# Patient Record
Sex: Female | Born: 1955 | Race: White | Hispanic: No | Marital: Married | Smoking: Former smoker
Health system: Southern US, Community
[De-identification: ages and names within clinical notes are randomized; demographics above are authoritative.]

## PROBLEM LIST (undated history)

## (undated) DIAGNOSIS — J189 Pneumonia, unspecified organism: Secondary | ICD-10-CM

## (undated) DIAGNOSIS — E876 Hypokalemia: Secondary | ICD-10-CM

## (undated) DIAGNOSIS — K5720 Diverticulitis of large intestine with perforation and abscess without bleeding: Secondary | ICD-10-CM

## (undated) DIAGNOSIS — J069 Acute upper respiratory infection, unspecified: Secondary | ICD-10-CM

## (undated) DIAGNOSIS — IMO0001 Reserved for inherently not codable concepts without codable children: Secondary | ICD-10-CM

## (undated) DIAGNOSIS — J302 Other seasonal allergic rhinitis: Secondary | ICD-10-CM

## (undated) DIAGNOSIS — I10 Essential (primary) hypertension: Secondary | ICD-10-CM

## (undated) DIAGNOSIS — R1084 Generalized abdominal pain: Secondary | ICD-10-CM

## (undated) DIAGNOSIS — K219 Gastro-esophageal reflux disease without esophagitis: Secondary | ICD-10-CM

## (undated) DIAGNOSIS — Z0000 Encounter for general adult medical examination without abnormal findings: Secondary | ICD-10-CM

## (undated) DIAGNOSIS — J42 Unspecified chronic bronchitis: Secondary | ICD-10-CM

## (undated) DIAGNOSIS — Z6841 Body Mass Index (BMI) 40.0 and over, adult: Secondary | ICD-10-CM

## (undated) DIAGNOSIS — N3941 Urge incontinence: Secondary | ICD-10-CM

## (undated) HISTORY — DX: Essential (primary) hypertension: I10

## (undated) HISTORY — DX: Body Mass Index (BMI) 40.0 and over, adult: Z684

## (undated) HISTORY — DX: Generalized abdominal pain: R10.84

## (undated) HISTORY — DX: Acute upper respiratory infection, unspecified: J06.9

## (undated) HISTORY — DX: Urge incontinence: N39.41

## (undated) HISTORY — DX: Diverticulitis of large intestine with perforation and abscess without bleeding: K57.20

## (undated) HISTORY — DX: Encounter for general adult medical examination without abnormal findings: Z00.00

## (undated) HISTORY — PX: BUNIONECTOMY: SHX129

## (undated) HISTORY — DX: Gastro-esophageal reflux disease without esophagitis: K21.9

## (undated) HISTORY — PX: TUBAL LIGATION: SHX77

## (undated) HISTORY — DX: Hypokalemia: E87.6

## (undated) HISTORY — DX: Other seasonal allergic rhinitis: J30.2

## (undated) HISTORY — DX: Reserved for inherently not codable concepts without codable children: IMO0001

## (undated) HISTORY — DX: Morbid (severe) obesity due to excess calories: E66.01

---

## 1989-05-25 HISTORY — PX: TUBAL LIGATION: SHX77

## 2004-12-29 ENCOUNTER — Ambulatory Visit: Payer: Self-pay | Admitting: Family Medicine

## 2006-12-08 ENCOUNTER — Ambulatory Visit: Payer: Self-pay | Admitting: Internal Medicine

## 2007-10-11 ENCOUNTER — Ambulatory Visit: Payer: Self-pay | Admitting: Urology

## 2007-10-17 ENCOUNTER — Ambulatory Visit: Payer: Self-pay | Admitting: Urology

## 2008-05-25 HISTORY — PX: PLANTAR FASCIA RELEASE: SHX2239

## 2008-08-21 ENCOUNTER — Ambulatory Visit: Payer: Self-pay | Admitting: Gastroenterology

## 2008-08-28 ENCOUNTER — Telehealth: Payer: Self-pay | Admitting: Gastroenterology

## 2008-09-03 ENCOUNTER — Ambulatory Visit: Payer: Self-pay | Admitting: Gastroenterology

## 2009-04-11 ENCOUNTER — Ambulatory Visit: Payer: Self-pay | Admitting: Podiatry

## 2011-03-31 ENCOUNTER — Telehealth: Payer: Self-pay | Admitting: Internal Medicine

## 2011-06-03 NOTE — Telephone Encounter (Signed)
error 

## 2011-09-04 ENCOUNTER — Other Ambulatory Visit: Payer: Self-pay | Admitting: Internal Medicine

## 2011-09-04 MED ORDER — ESOMEPRAZOLE MAGNESIUM 40 MG PO CPDR: 40.0000 mg | DELAYED_RELEASE_CAPSULE | Freq: Every day | ORAL | Status: DC

## 2012-04-06 ENCOUNTER — Encounter: Payer: Self-pay | Admitting: Internal Medicine

## 2012-04-06 ENCOUNTER — Ambulatory Visit (INDEPENDENT_AMBULATORY_CARE_PROVIDER_SITE_OTHER): Payer: BC Managed Care – PPO | Admitting: Internal Medicine

## 2012-04-06 VITALS — BP 124/88 | HR 61 | Temp 98.0°F | Ht 67.0 in | Wt 239.1 lb

## 2012-04-06 DIAGNOSIS — R05 Cough: Secondary | ICD-10-CM

## 2012-04-06 MED ORDER — LORATADINE 10 MG PO TABS: ORAL_TABLET | ORAL | Status: AC

## 2012-04-06 MED ORDER — MOMETASONE FURO-FORMOTEROL FUM 100-5 MCG/ACT IN AERO: INHALATION_SPRAY | RESPIRATORY_TRACT | Status: DC

## 2012-04-06 NOTE — Progress Notes (Signed)
  Subjective:    Patient ID: Judith Nelson, female    DOB: 1955/12/23  MRN: [id number]  HPI  37 yowm quit smoking 1990 no trouble at all then onset of episodes of bronchitis starting in 2009 referred 04/06/2012 by Dr Darrick Huntsman to [facility] office for evaluation.   04/06/2012 1st pulmonary evaluation (previously neg allergy by [facility])  on maint clariton cc episodic acute onest runny nose, sneezing, clear mucus evolving over 3-4 days to bad coughing rattling in chest this episode started first day of November 2013 so restarted advair, singulair, prednisone taper off on day of ov still with rattling in upper chest, sinus symptoms better, no purulent sputum.  Sob with exertion but not adls  Typical of prev episodes 4 x in past year, 100% better between while being maintained only on clariton but no maint ppi or inhalers and "doesn't want to take a lot of meds".   Sleeping ok without nocturnal  or early am exacerbation  of respiratory  c/o's or need for noct saba. Also denies any obvious fluctuation of symptoms with weather or environmental changes or other aggravating or alleviating factors except as outlined above    Review of Systems  Constitutional: Negative for fever, chills and unexpected weight change.  HENT: Positive for congestion and sneezing. Negative for ear pain, nosebleeds, sore throat, rhinorrhea, trouble swallowing, dental problem, voice change, postnasal drip and sinus pressure.   Eyes: Negative for visual disturbance.  Respiratory: Positive for cough and shortness of breath. Negative for choking.   Cardiovascular: Negative for chest pain and leg swelling.  Gastrointestinal: Negative for vomiting, abdominal pain and diarrhea.  Genitourinary: Negative for difficulty urinating.  Musculoskeletal: Negative for arthralgias.  Skin: Negative for rash.  Neurological: Positive for headaches. Negative for tremors and syncope.  Hematological: Does not bruise/bleed easily.       Objective:     Physical Exam  amb mod obese with wf with rattling upper airway cough with some pseudowheezing Wt Readings from Last 3 Encounters:  04/06/12 239 lb 1.9 oz (108.464 kg)   HEENT: nl dentition, turbinates, and orophanx. Nl external ear canals without cough reflex   NECK :  without JVD/Nodes/TM/ nl carotid upstrokes bilaterally   LUNGS: no acc muscle use, trace end exp wheeze vs pseudowheeze without cough on insp or exp maneuvers   CV:  RRR  no s3 or murmur or increase in P2, no edema   ABD:  soft and nontender with nl excursion in the supine position. No bruits or organomegaly, bowel sounds nl  MS:  warm without deformities, calf tenderness, cyanosis or clubbing  SKIN: warm and dry without lesions    NEURO:  alert, approp, no deficits        Assessment & Plan:

## 2012-04-06 NOTE — Patient Instructions (Addendum)
At the first sign of any respiratory flare start nexium 40 mg Take 30-60 min before first meal of the day and Pepcid at bedtime until better for an entire week   For cough / wheezing/ short of breath dulera 100 2 every 12hours   GERD (REFLUX)  is an extremely common cause of respiratory symptoms, many times with no significant heartburn at all.    It can be treated with medication, but also with lifestyle changes including avoidance of late meals, excessive alcohol, smoking cessation, and avoid fatty foods, chocolate, peppermint, colas, red wine, and acidic juices such as orange juice.  NO MINT OR MENTHOL PRODUCTS SO NO COUGH DROPS  USE SUGARLESS CANDY INSTEAD (jolley ranchers or Stover's)  NO OIL BASED VITAMINS - use powdered substitutes.  Stop singulair   For itchy sneezing or runny nose take clariton or Zyrtec daily    If you are satisfied with your treatment plan let your doctor know and  she can either refill your medications or you can return here when your prescription runs out.     If in any way you are not 100% satisfied,  please tell us.  If 100% better, tell your friends!

## 2012-04-07 NOTE — Assessment & Plan Note (Signed)
The most common causes of chronic cough in immunocompetent adults include the following: upper airway cough syndrome (UACS), previously referred to as postnasal drip syndrome (PNDS), which is caused by variety of rhinosinus conditions; (2) asthma; (3) GERD; (4) chronic bronchitis from cigarette smoking or other inhaled environmental irritants; (5) nonasthmatic eosinophilic bronchitis; and (6) bronchiectasis.   These conditions, singly or in combination, have accounted for up to 94% of the causes of chronic cough in prospective studies.   Other conditions have constituted no >6% of the causes in prospective studies These have included bronchogenic carcinoma, chronic interstitial pneumonia, sarcoidosis, left ventricular failure, ACEI-induced cough, and aspiration from a condition associated with pharyngeal dysfunction.   The pattern of this cough sounds like episodic asthma but with > 1 exac per year may need to consider chronic ICS, which she's reluctant to do.  In meantime,  Explained natural history of uri and why it's necessary in patients at risk to treat GERD aggressively  at least  short term   to reduce risk of evolving cyclical cough initially  triggered by epithelial injury and a heightened sensitivty to the effects of any upper airway irritants,  most importantly acid - related.  That is, the more sensitive the epithelium damaged for virus, the more the cough, the more the secondary reflux (especially in those prone to reflux) the more the irritation of the sensitive mucosa and so on in a cyclical pattern.   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

## 2012-05-27 ENCOUNTER — Other Ambulatory Visit: Payer: Self-pay | Admitting: *Deleted

## 2012-05-27 MED ORDER — ESOMEPRAZOLE MAGNESIUM 40 MG PO CPDR: 40.0000 mg | DELAYED_RELEASE_CAPSULE | Freq: Every day | ORAL | Status: DC | PRN

## 2013-12-26 ENCOUNTER — Encounter: Payer: Self-pay | Admitting: Gastroenterology

## 2014-03-23 ENCOUNTER — Other Ambulatory Visit: Payer: Self-pay | Admitting: *Deleted

## 2014-03-23 ENCOUNTER — Encounter: Payer: Self-pay | Admitting: Cardiovascular Disease

## 2014-03-23 ENCOUNTER — Telehealth: Payer: Self-pay

## 2014-03-23 ENCOUNTER — Ambulatory Visit (INDEPENDENT_AMBULATORY_CARE_PROVIDER_SITE_OTHER): Payer: BC Managed Care – PPO | Admitting: Cardiovascular Disease

## 2014-03-23 ENCOUNTER — Encounter (INDEPENDENT_AMBULATORY_CARE_PROVIDER_SITE_OTHER): Payer: Self-pay

## 2014-03-23 VITALS — BP 160/98 | HR 79 | Ht 67.0 in | Wt 242.8 lb

## 2014-03-23 DIAGNOSIS — I10 Essential (primary) hypertension: Secondary | ICD-10-CM

## 2014-03-23 DIAGNOSIS — R079 Chest pain, unspecified: Secondary | ICD-10-CM

## 2014-03-23 DIAGNOSIS — R0602 Shortness of breath: Secondary | ICD-10-CM

## 2014-03-23 DIAGNOSIS — E785 Hyperlipidemia, unspecified: Secondary | ICD-10-CM

## 2014-03-23 DIAGNOSIS — R0609 Other forms of dyspnea: Secondary | ICD-10-CM

## 2014-03-23 HISTORY — DX: Essential (primary) hypertension: I10

## 2014-03-23 MED ORDER — ATORVASTATIN CALCIUM 40 MG PO TABS: 40.0000 mg | ORAL_TABLET | Freq: Every day | ORAL | Status: DC

## 2014-03-23 MED ORDER — LISINOPRIL 10 MG PO TABS: 10.0000 mg | ORAL_TABLET | Freq: Every day | ORAL | Status: DC

## 2014-03-23 MED ORDER — HYDROCHLOROTHIAZIDE 25 MG PO TABS: 25.0000 mg | ORAL_TABLET | Freq: Every day | ORAL | Status: DC

## 2014-03-23 MED ORDER — POTASSIUM CHLORIDE CRYS ER 20 MEQ PO TBCR: 20.0000 meq | EXTENDED_RELEASE_TABLET | Freq: Every day | ORAL | Status: DC

## 2014-03-23 NOTE — Telephone Encounter (Signed)
Requested Prescriptions   Signed Prescriptions Disp Refills  . lisinopril (PRINIVIL,ZESTRIL) 10 MG tablet 30 tablet 3    Sig: Take 1 tablet (10 mg total) by mouth daily.    Authorizing Provider: Thayer Headings    Ordering User: Britt Bottom

## 2014-03-23 NOTE — Progress Notes (Signed)
Glenard Haring Date of Birth  03/27/1956       [facility] Office [address], [place], [place]  [address]   [place], [place]  [address] [place]   Fax  [phone]     Fax [phone]  Problem List: 1. Hypertension 2. Hyperlipidemia  History of Present Illness:  Judith Nelson is a 58 year old female who is self-referred. She has a strong family history of multiple cardiac issues. She has a history of hypertension and hyperlipidemia. She does not go to the doctor on a regular basis.  She goes to the wellness center at [place]. Her BP was noted to be elevated.    She has had a stress test in the past (at [facility]) .  Was negative.  Does not watch her salt - eats broth frequently.   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    Takes care of her mother. Does not get any regular exercise. No Cp.  She does have DOE .  Has shortness of breath walking up the stairs up to her desk.   Non smoker , quit 20 years ago - was smoking 2 ppd. ETOH - on occasion Fhx:  Mother - CABG, carotid disease.( smoker)  Father - DM, ETOH , pancreatic cancer   Current Outpatient Prescriptions on File Prior to Visit  Medication Sig Dispense Refill  . Cholecalciferol (VITAMIN D PO) Take 5,000 Units by mouth daily.      . Cyanocobalamin (VITAMIN B 12 PO) 5000 mcg daily      . dextromethorphan-guaiFENesin (MUCINEX DM) 30-600 MG per 12 hr tablet Take 1 tablet by mouth every 12 (twelve) hours.      . diphenhydrAMINE (BENADRYL) 50 MG capsule Take 50 mg by mouth at bedtime.      Marland Kitchen esomeprazole (NEXIUM) 40 MG capsule Take 1 capsule (40 mg total) by mouth daily as needed.  30 capsule  2  . loratadine (CLARITIN) 10 MG tablet One daily as needed for itching and sneezing  30 tablet    . Multiple Vitamin (MULTIVITAMIN) capsule Take 1 capsule by mouth daily.       No current facility-administered medications on file  prior to visit.    Allergies  Allergen Reactions  . Sulfa Antibiotics     Fever- had reaction as a child    Past Medical History  Diagnosis Date  . Chronic headache   . Diverticulitis   . Palpitations   . Hypertension   . Reflux     Past Surgical History  Procedure Laterality Date  . Tubal ligation    . Bunionectomy      History  Smoking status  . Former Smoker -- 2.00 packs/day for 15 years  . Types: Cigarettes  . Quit date: 05/25/1988  Smokeless tobacco  . Never Used    History  Alcohol Use  . 1.2 oz/week  . 2 Cans of beer per week    Family History  Problem Relation Age of Onset  . Hypertension Mother   . Hyperlipidemia Mother   . Heart disease Mother     CABG & valve replacement   . Hypertension Father   . Hyperlipidemia Sister   . Hypertension Sister   . Hypertension Sister   . Hyperlipidemia Sister   . Hypertension Sister   . Hyperlipidemia Sister     Reviw of Systems:  Reviewed in  the HPI.  All other systems are negative.  Physical Exam: Blood pressure 160/98, pulse 79, height 5\' 7"  (1.702 m), weight 242 lb 12 oz (110.111 kg). Wt Readings from Last 3 Encounters:  03/23/14 242 lb 12 oz (110.111 kg)  04/06/12 239 lb 1.9 oz (108.464 kg)     General: Well developed, well nourished, in no acute distress.  Head: Normocephalic, atraumatic, sclera non-icteric, mucus membranes are moist,   Neck: Supple. Carotids are 2 + without bruits. No JVD   Lungs: Clear   Heart: RR, normal S1S2  Abdomen: Soft, non-tender, non-distended with normal bowel sounds.  Msk:  Strength and tone are normal   Extremities: No clubbing or cyanosis. No edema.  Distal pedal pulses are 2+ and equal    Neuro: CN II - XII intact.  Alert and oriented X 3.   Psych:  Normal   ECG: 03/23/2014: Normal sinus rhythm at 79. She has no ST or T wave changes.  Assessment / Plan:

## 2014-03-23 NOTE — Assessment & Plan Note (Signed)
Will start her on atorvastatin 40 mg a day. Check lipids, liver, BPM in 3 months.

## 2014-03-23 NOTE — Telephone Encounter (Signed)
Medication made to Rx list: DC HCTZ  DC K-Dur  Refilled: Lisinopril 10 mg qd [facility]

## 2014-03-23 NOTE — Telephone Encounter (Signed)
Please see note below. 

## 2014-03-23 NOTE — Addendum Note (Signed)
Addended by: Britt Bottom on: 03/23/2014 04:16 PM   Modules accepted: Orders, Medications

## 2014-03-23 NOTE — Telephone Encounter (Signed)
Pt called, states she was prescribed a medication this morning with sulfa, and she is allergic to it. States if she would have taken this medicine she would have been hospitalized . Pt states she is going for a second opinion. I reassured pt a nurse would call her.

## 2014-03-23 NOTE — Telephone Encounter (Signed)
*  chang

## 2014-03-23 NOTE — Assessment & Plan Note (Addendum)
Judith Nelson has  had hypertension for years. She was recently seen at the wellness Center and was found to have markedly elevated blood pressure. She does not make any attempt to avoid salt and in fact from her description, it sounds like she eats a fairly high salt diet.  We'll start her on hydrochlorothiazide 25 mg a day and potassium chloride 20 mEq a day. I've advised her to stay away from eating excess salt. BMP in 1 month  Have recommended that she work on a better diet, exercise, and weight loss plan. Think that a lot of her shortness breath is due to her hypertension and affected her blood pressure likely dose up with any sort of exertion.

## 2014-03-23 NOTE — Telephone Encounter (Signed)
I agree with starting Lisinopril 10 mg a day DC HCTZ DC kdur  She should get a BMP in 1 month .  It can be drawn here or at her primary medical doctor's office. i can see her back if she would like. Or if she would prefer, she can follow up with primary.

## 2014-03-23 NOTE — Telephone Encounter (Signed)
Spoke with pt.  Explained the difference between sulfa compounds within sulfa abx and HCTZ.  She understands but still has some concerns.  Her reaction as a child was a fever but she also had a UTI at the time.  ? If it was even related to the sulfa abx.  She does have the medication but does not want to start it over the weekend.  Will discuss with Dr. Acie Fredrickson to see if we can initiate alternate therapy first.  We also discussed the need for a PCP.  She states after her visit today she realized that is probably who she needed to see rather than a cardiologist.  She is interested in establishing care with [facility] in [place].  Agreed this would be the best plan moving forward.

## 2014-03-23 NOTE — Addendum Note (Signed)
Addended by: Britt Bottom on: 03/23/2014 04:49 PM   Modules accepted: Orders, Medications

## 2014-03-23 NOTE — Assessment & Plan Note (Signed)
Judith Nelson has significant shortness of breath with exertion. I suspect most of this was due to her weight and generalized deconditioning. She's not had any symptoms that sound like ischemia.  I have recommended that she start a regular exercise program. If she is able to advance in her exercise regimen, then I do not think that she needs any additional testing. On the other hand if she develops episodes of chest tightness with exertion, we will need to perform other evaluations

## 2014-03-23 NOTE — Patient Instructions (Addendum)
Start Hydrochlorothiazide 25 mg take one tablet daily. Start K-Dur 20 meq take one tablet daily. Start Atorvastatin 40 mg take one tablet daily at bedtime.  Need to have blood work in 1 month: BMP  Follow up in 3 months with Dr. Acie Fredrickson with BMP/LIV/LIP

## 2014-03-26 NOTE — Telephone Encounter (Signed)
Left message for pt to call back  °

## 2014-03-28 NOTE — Telephone Encounter (Signed)
Left detailed message on pt's vm w/ Dr. Elmarie Shiley recommendation.  Asked her to call back to confirm and to schedule lab appt.

## 2014-04-27 ENCOUNTER — Ambulatory Visit: Payer: BC Managed Care – PPO | Admitting: Cardiovascular Disease

## 2014-07-12 ENCOUNTER — Telehealth: Payer: Self-pay | Admitting: *Deleted

## 2014-07-12 NOTE — Telephone Encounter (Signed)
Pt called asking if we could send over records to Dr.Fath.

## 2014-07-19 DIAGNOSIS — E876 Hypokalemia: Secondary | ICD-10-CM | POA: Insufficient documentation

## 2014-07-19 HISTORY — DX: Hypokalemia: E87.6

## 2015-05-29 ENCOUNTER — Ambulatory Visit (INDEPENDENT_AMBULATORY_CARE_PROVIDER_SITE_OTHER): Payer: BLUE CROSS/BLUE SHIELD | Admitting: Family Medicine

## 2015-05-29 ENCOUNTER — Encounter: Payer: Self-pay | Admitting: Family Medicine

## 2015-05-29 VITALS — BP 152/90 | HR 70 | Temp 98.5°F | Ht 67.0 in | Wt 257.8 lb

## 2015-05-29 DIAGNOSIS — Z0000 Encounter for general adult medical examination without abnormal findings: Secondary | ICD-10-CM | POA: Diagnosis not present

## 2015-05-29 DIAGNOSIS — Z130 Encounter for screening for diseases of the blood and blood-forming organs and certain disorders involving the immune mechanism: Secondary | ICD-10-CM

## 2015-05-29 DIAGNOSIS — N3941 Urge incontinence: Secondary | ICD-10-CM | POA: Diagnosis not present

## 2015-05-29 DIAGNOSIS — I10 Essential (primary) hypertension: Secondary | ICD-10-CM | POA: Diagnosis not present

## 2015-05-29 DIAGNOSIS — J069 Acute upper respiratory infection, unspecified: Secondary | ICD-10-CM

## 2015-05-29 DIAGNOSIS — K219 Gastro-esophageal reflux disease without esophagitis: Secondary | ICD-10-CM | POA: Insufficient documentation

## 2015-05-29 DIAGNOSIS — Z6841 Body Mass Index (BMI) 40.0 and over, adult: Secondary | ICD-10-CM | POA: Diagnosis not present

## 2015-05-29 HISTORY — DX: Urge incontinence: N39.41

## 2015-05-29 HISTORY — DX: Encounter for general adult medical examination without abnormal findings: Z00.00

## 2015-05-29 HISTORY — DX: Acute upper respiratory infection, unspecified: J06.9

## 2015-05-29 HISTORY — DX: Morbid (severe) obesity due to excess calories: E66.01

## 2015-05-29 LAB — COMPREHENSIVE METABOLIC PANEL
ALT: 22 U/L (ref 0–35)
AST: 17 U/L (ref 0–37)
Albumin: 4.2 g/dL (ref 3.5–5.2)
Alkaline Phosphatase: 47 U/L (ref 39–117)
BUN: 9 mg/dL (ref 6–23)
CHLORIDE: 103 meq/L (ref 96–112)
CO2: 30 meq/L (ref 19–32)
CREATININE: 0.77 mg/dL (ref 0.40–1.20)
Calcium: 10 mg/dL (ref 8.4–10.5)
GFR: 81.76 mL/min (ref >60.00)
GLUCOSE: 95 mg/dL (ref 70–99)
Potassium: 4.6 mEq/L (ref 3.5–5.1)
SODIUM: 140 meq/L (ref 135–145)
Total Bilirubin: 0.5 mg/dL (ref 0.2–1.2)
Total Protein: 6.9 g/dL (ref 6.0–8.3)

## 2015-05-29 LAB — LDL CHOLESTEROL, DIRECT: LDL DIRECT: 174 mg/dL

## 2015-05-29 LAB — CBC
HCT: 45.7 % (ref 36.0–46.0)
Hemoglobin: 15.4 g/dL — ABNORMAL HIGH (ref 12.0–15.0)
MCHC: 33.6 g/dL (ref 30.0–36.0)
MCV: 89.8 fl (ref 78.0–100.0)
Platelets: 254 10*3/uL (ref 150.0–400.0)
RBC: 5.09 Mil/uL (ref 3.87–5.11)
RDW: 13.5 % (ref 11.5–15.5)
WBC: 9.3 10*3/uL (ref 4.0–10.5)

## 2015-05-29 LAB — HEMOGLOBIN A1C: Hgb A1c MFr Bld: 5.3 % (ref 4.6–6.5)

## 2015-05-29 LAB — LIPID PANEL
CHOL/HDL RATIO: 5
Cholesterol: 237 mg/dL — ABNORMAL HIGH (ref 0–200)
HDL: 43.1 mg/dL (ref >39.00)
NONHDL: 193.66
Triglycerides: 205 mg/dL — ABNORMAL HIGH (ref 0.0–149.0)
VLDL: 41 mg/dL — AB (ref 0.0–40.0)

## 2015-05-29 LAB — TSH: TSH: 2.36 u[IU]/mL (ref 0.35–4.50)

## 2015-05-29 MED ORDER — TOLTERODINE TARTRATE ER 4 MG PO CP24: 4.0000 mg | ORAL_CAPSULE | Freq: Every day | ORAL | Status: DC

## 2015-05-29 NOTE — Progress Notes (Signed)
Pre visit review using our clinic review tool, if applicable. No additional management support is needed unless otherwise documented below in the visit note. 

## 2015-05-29 NOTE — Progress Notes (Signed)
Subjective:  Patient ID: Glenard Haring, female    DOB: 11-02-1955  Age: 60 y.o. MRN: [id number]  CC: Establish care; cold; incontinence  HPI JESSAMINE BARCIA is a 60 y.o. female presents to the clinic today to establish care.   Preventative Healthcare  Pap smear: Years ago. In need of pap smear. Patient states that she  Has been given "conflicting information" about Pap smear.  Mammogram:  Has never had a mammogram.  Colonoscopy: Up to date (done in 2010).  Immunizations  Tetanus -  Unsure when she's had a but states that it was in the last 10 years.  Pneumococcal -  Not indicated.  Flu -  Up-to-date.  Zoster -  Not indicated.  Hepatitis C screening -  Has not had screening.  Labs:  In need of screening labs today.  Alcohol use: See below.  Smoking/tobacco use: Former smoker.   Regular dental exams: Yes.   Cold  Patient states that she's had cold symptoms since Sunday.  She states that she's been expansion process drip, sinus congestion.  No associated fever.  She's been using Mucinex D with some improvement.  No known exacerbating factors.  She states that she has had recent sick contacts in that her husband is also   Incontinence  Patient reports a long-standing history of incontinence.  Patient states that she has significant urgency and often cannot get to the restroom quick enough.  She states that she was on medication previously.  Patient states that this is quite bothersome.  No reports of dysuria.  Patient like to discuss therapeutic options today.  No known exacerbating or relieving factors.  PMH, Surgical Hx, Family Hx, Social History reviewed and updated as below.  Past Medical History  Diagnosis Date  . Hypertension   . Reflux   . GERD (gastroesophageal reflux disease)    Past Surgical History  Procedure Laterality Date  . Tubal ligation    . Bunionectomy    . Plantar fascia release Right 2010   Family History  Problem  Relation Age of Onset  . Hypertension Mother   . Hyperlipidemia Mother   . Heart disease Mother     CABG & valve replacement   . Diabetes Mother   . Hypertension Father   . Hyperlipidemia Sister   . Hypertension Sister   . Hypertension Sister   . Hyperlipidemia Sister   . Hypertension Sister   . Hyperlipidemia Sister    Social History  Substance Use Topics  . Smoking status: Former Smoker -- 2.00 packs/day for 15 years    Types: Cigarettes    Quit date: 05/25/1988  . Smokeless tobacco: Never Used  . Alcohol Use: 1.8 oz/week    1 Cans of beer, 2 Glasses of wine per week   Review of Systems  Genitourinary: Positive for urgency.       Incontinence. Decreased libido.  All other systems reviewed and are negative.   Objective:   Today's Vitals: BP 152/90 mmHg  Pulse 70  Temp(Src) 98.5 F (36.9 C) (Oral)  Ht _0  (1.702 m)  Wt 257 lb 12 oz (116.915 kg)  BMI 40.36 kg/m2  SpO2 96%  Physical Exam  Constitutional: She is oriented to person, place, and time. She appears well-developed. No distress.  HENT:  Head: Normocephalic and atraumatic.  Nose: Nose normal.  Mouth/Throat: Oropharynx is clear and moist. No oropharyngeal exudate.  Normal TM's bilaterally.   Eyes: Conjunctivae are normal. No scleral icterus.  Neck: Neck supple.  Cardiovascular: Normal rate and regular rhythm.   No murmur heard. Pulmonary/Chest: Effort normal and breath sounds normal. She has no wheezes. She has no rales.  Abdominal: Soft. She exhibits no distension. There is no tenderness. There is no rebound and no guarding.  Musculoskeletal: Normal range of motion.  Lymphadenopathy:    She has no cervical adenopathy.  Neurological: She is alert and oriented to person, place, and time.  Skin: Skin is warm and dry. No rash noted.  Psychiatric: She has a normal mood and affect.  Vitals reviewed.  Assessment & Plan:   Problem List Items Addressed This Visit    URI (upper respiratory infection)     New problem. Viral in origin. Continue supportive care and PRN Mucinex.      Urge incontinence    Trial of Detrol. Rx sent today.      Relevant Medications   tolterodine (DETROL LA) 4 MG 24 hr capsule   Preventative health care - Primary    I had a long discussion with patient today about Pap smear and mammogram screening.  I informed her that these 2 items are currently recommended as the evidence supports their use for prevention/early detection. Patient states that she will think about it. Tetanus and flu up to date. Colonoscopy up to date. Screening labs today: CBC, CMP, Lipid, A1c, TSH.      Morbid obesity with BMI of 40.0-44.9, adult (HCC)   Relevant Orders   HgB A1c   Lipid panel   TSH   HTN (hypertension)    Patient not currently on medication.  BP elevated today. Will reeval in one week and if remains elevated will plan to discuss medication.      Relevant Orders   Comp Met (CMET)    Other Visit Diagnoses    Screening for deficiency anemia        Relevant Orders    CBC       Outpatient Encounter Prescriptions as of 05/29/2015  Medication Sig  . Cholecalciferol (VITAMIN D PO) Take 5,000 Units by mouth daily.  . Cyanocobalamin (VITAMIN B 12 PO) 5000 mcg daily  . dextromethorphan-guaiFENesin (MUCINEX DM) 30-600 MG per 12 hr tablet Take 1 tablet by mouth every 12 (twelve) hours.  Marland Kitchen esomeprazole (NEXIUM) 40 MG capsule Take 40 mg by mouth daily at 12 noon.  . loratadine (CLARITIN) 10 MG tablet One daily as needed for itching and sneezing  . Multiple Vitamin (MULTIVITAMIN) capsule Take 1 capsule by mouth daily.  . Omega-3 Fatty Acids (FISH OIL) 1000 MG CAPS Take by mouth.  . tolterodine (DETROL LA) 4 MG 24 hr capsule Take 1 capsule (4 mg total) by mouth daily.  . [DISCONTINUED] ADVAIR DISKUS 100-50 MCG/DOSE AEPB Inhale 1 puff into the lungs as needed. Reported on 05/29/2015  . [DISCONTINUED] atorvastatin (LIPITOR) 40 MG tablet Take 1 tablet (40 mg total) by mouth  daily. (Patient not taking: Reported on 05/29/2015)  . [DISCONTINUED] diphenhydrAMINE (BENADRYL) 50 MG capsule Take 50 mg by mouth at bedtime. Reported on 05/29/2015  . [DISCONTINUED] esomeprazole (NEXIUM) 40 MG capsule Take 1 capsule (40 mg total) by mouth daily as needed.  . [DISCONTINUED] lisinopril (PRINIVIL,ZESTRIL) 10 MG tablet Take 1 tablet (10 mg total) by mouth daily. (Patient not taking: Reported on 05/29/2015)   No facility-administered encounter medications on file as of 05/29/2015.    Follow-up: Return in about 1 week (around 06/05/2015) for BP check - Nurse visit.  [place]

## 2015-05-29 NOTE — Assessment & Plan Note (Signed)
New problem. Viral in origin. Continue supportive care and PRN Mucinex.

## 2015-05-29 NOTE — Patient Instructions (Signed)
Take the medication as prescribed.  Consider mammogram, pap smear and colonoscopy.  Follow up in 1 week for a nurse visit for a BP check. If elevated at that time, we will need to discuss medication.   We will call with your lab results.  Take care  Dr. Lacinda Axon

## 2015-05-29 NOTE — Assessment & Plan Note (Signed)
Patient not currently on medication.  BP elevated today. Will reeval in one week and if remains elevated will plan to discuss medication.

## 2015-05-29 NOTE — Assessment & Plan Note (Addendum)
I had a long discussion with patient today about Pap smear and mammogram screening.  I informed her that these 2 items are currently recommended as the evidence supports their use for prevention/early detection. Patient states that she will think about it. Tetanus and flu up to date. Colonoscopy up to date. Screening labs today: CBC, CMP, Lipid, A1c, TSH.

## 2015-05-29 NOTE — Assessment & Plan Note (Signed)
Trial of Detrol. Rx sent today.

## 2015-06-07 ENCOUNTER — Telehealth: Payer: Self-pay

## 2015-06-07 NOTE — Telephone Encounter (Signed)
LMOM to call office back, pt stated that she was talking to someone but didn't give specific information as to the reason for her call./tvw

## 2015-06-07 NOTE — Telephone Encounter (Signed)
Called dealing with her husbands results for x-ray. She was upset that he was not given better results for x-ray of back she states he doesn't need to be taken NSAID's she is worried about his kidneys. She states the pain medication prescribed her husband wishes not to take it unless its his last resort. Please advise a medication that can be prescribed or a referral.

## 2015-06-07 NOTE — Telephone Encounter (Signed)
Pt stated that she was calling [facility] Dr. Geralynn Ochs cma, pt did not specify the reason for her call, but wanted to talk to the cma./tvw

## 2015-06-07 NOTE — Telephone Encounter (Signed)
Xray of the lumbar spine revealed severe changes throughout the lumbar spine. I recommend a short course of the anti-inflammatory. His kidney function is normal.  He can follow up with me regarding this. Depending on how bothersome his pain is he can consider being seen by a neurosurgeon for discussion about surgery.

## 2015-08-27 ENCOUNTER — Other Ambulatory Visit: Payer: Self-pay | Admitting: Family Medicine

## 2015-08-27 DIAGNOSIS — Z1231 Encounter for screening mammogram for malignant neoplasm of breast: Secondary | ICD-10-CM

## 2015-08-29 ENCOUNTER — Ambulatory Visit
Admission: RE | Admit: 2015-08-29 | Discharge: 2015-08-29 | Disposition: A | Discharge status: Home / Self Care | Payer: BLUE CROSS/BLUE SHIELD | Source: Ambulatory Visit | Attending: Family Medicine | Admitting: Family Medicine

## 2015-08-29 DIAGNOSIS — Z1231 Encounter for screening mammogram for malignant neoplasm of breast: Secondary | ICD-10-CM | POA: Insufficient documentation

## 2015-10-17 ENCOUNTER — Telehealth: Payer: Self-pay | Admitting: *Deleted

## 2015-10-17 NOTE — Telephone Encounter (Signed)
This is okay by me.

## 2015-10-17 NOTE — Telephone Encounter (Signed)
Patient has requested to switch providers , she has requested that her and her husband switch to Dr. Caryl Bis. Please advise if this will be ok. Her husband name is Brittanye Lewter)

## 2015-11-14 DIAGNOSIS — J302 Other seasonal allergic rhinitis: Secondary | ICD-10-CM

## 2015-11-14 HISTORY — DX: Other seasonal allergic rhinitis: J30.2

## 2016-02-19 ENCOUNTER — Other Ambulatory Visit: Payer: Self-pay | Admitting: Orthopedic Surgery

## 2016-02-19 DIAGNOSIS — M25561 Pain in right knee: Secondary | ICD-10-CM

## 2016-03-03 ENCOUNTER — Ambulatory Visit: Payer: BLUE CROSS/BLUE SHIELD

## 2016-03-11 ENCOUNTER — Other Ambulatory Visit: Payer: Self-pay | Admitting: Medical

## 2016-03-11 DIAGNOSIS — M25561 Pain in right knee: Secondary | ICD-10-CM

## 2016-03-25 ENCOUNTER — Ambulatory Visit
Admission: RE | Admit: 2016-03-25 | Discharge: 2016-03-25 | Disposition: A | Discharge status: Home / Self Care | Payer: BLUE CROSS/BLUE SHIELD | Source: Ambulatory Visit | Attending: Medical | Admitting: Medical

## 2016-03-25 DIAGNOSIS — M25561 Pain in right knee: Secondary | ICD-10-CM

## 2016-06-18 ENCOUNTER — Inpatient Hospital Stay
Admission: EM | Admit: 2016-06-18 | Discharge: 2016-06-25 | DRG: 392 | Disposition: A | Discharge status: Home / Self Care | Payer: BLUE CROSS/BLUE SHIELD | Attending: Surgery | Admitting: Surgery

## 2016-06-18 ENCOUNTER — Encounter: Payer: Self-pay | Admitting: Emergency Medicine

## 2016-06-18 ENCOUNTER — Emergency Department: Payer: BLUE CROSS/BLUE SHIELD

## 2016-06-18 DIAGNOSIS — K219 Gastro-esophageal reflux disease without esophagitis: Secondary | ICD-10-CM | POA: Diagnosis present

## 2016-06-18 DIAGNOSIS — Z79899 Other long term (current) drug therapy: Secondary | ICD-10-CM | POA: Diagnosis not present

## 2016-06-18 DIAGNOSIS — Z9851 Tubal ligation status: Secondary | ICD-10-CM

## 2016-06-18 DIAGNOSIS — Z9109 Other allergy status, other than to drugs and biological substances: Secondary | ICD-10-CM

## 2016-06-18 DIAGNOSIS — Z9889 Other specified postprocedural states: Secondary | ICD-10-CM

## 2016-06-18 DIAGNOSIS — I10 Essential (primary) hypertension: Secondary | ICD-10-CM | POA: Diagnosis present

## 2016-06-18 DIAGNOSIS — K5720 Diverticulitis of large intestine with perforation and abscess without bleeding: Secondary | ICD-10-CM | POA: Diagnosis present

## 2016-06-18 DIAGNOSIS — Z87891 Personal history of nicotine dependence: Secondary | ICD-10-CM

## 2016-06-18 DIAGNOSIS — Z833 Family history of diabetes mellitus: Secondary | ICD-10-CM

## 2016-06-18 DIAGNOSIS — Z882 Allergy status to sulfonamides status: Secondary | ICD-10-CM

## 2016-06-18 DIAGNOSIS — Z8249 Family history of ischemic heart disease and other diseases of the circulatory system: Secondary | ICD-10-CM

## 2016-06-18 DIAGNOSIS — R1084 Generalized abdominal pain: Secondary | ICD-10-CM | POA: Diagnosis not present

## 2016-06-18 DIAGNOSIS — R109 Unspecified abdominal pain: Secondary | ICD-10-CM

## 2016-06-18 HISTORY — DX: Diverticulitis of large intestine with perforation and abscess without bleeding: K57.20

## 2016-06-18 LAB — URINALYSIS, COMPLETE (UACMP) WITH MICROSCOPIC
BACTERIA UA: NONE SEEN
Bilirubin Urine: NEGATIVE
Glucose, UA: NEGATIVE mg/dL
Ketones, ur: NEGATIVE mg/dL
LEUKOCYTES UA: NEGATIVE
Nitrite: POSITIVE — AB
PROTEIN: NEGATIVE mg/dL
SPECIFIC GRAVITY, URINE: 1.003 — AB (ref 1.005–1.030)
WBC, UA: NONE SEEN WBC/hpf (ref 0–5)
pH: 7 (ref 5.0–8.0)

## 2016-06-18 LAB — CBC WITH DIFFERENTIAL/PLATELET
BASOS ABS: 0.1 10*3/uL (ref 0–0.1)
BASOS PCT: 0 %
EOS ABS: 0.1 10*3/uL (ref 0–0.7)
Eosinophils Relative: 0 %
HCT: 43.1 % (ref 35.0–47.0)
HEMOGLOBIN: 15.3 g/dL (ref 12.0–16.0)
LYMPHS ABS: 1.4 10*3/uL (ref 1.0–3.6)
Lymphocytes Relative: 6 %
MCH: 30.1 pg (ref 26.0–34.0)
MCHC: 35.5 g/dL (ref 32.0–36.0)
MCV: 84.8 fL (ref 80.0–100.0)
Monocytes Absolute: 0.9 10*3/uL (ref 0.2–0.9)
Monocytes Relative: 4 %
NEUTROS PCT: 90 %
Neutro Abs: 19 10*3/uL — ABNORMAL HIGH (ref 1.4–6.5)
Platelets: 241 10*3/uL (ref 150–440)
RBC: 5.08 MIL/uL (ref 3.80–5.20)
RDW: 13.2 % (ref 11.5–14.5)
WBC: 21.4 10*3/uL — AB (ref 3.6–11.0)

## 2016-06-18 LAB — BASIC METABOLIC PANEL
Anion gap: 9 (ref 5–15)
BUN: 11 mg/dL (ref 6–20)
CALCIUM: 9.2 mg/dL (ref 8.9–10.3)
CHLORIDE: 97 mmol/L — AB (ref 101–111)
CO2: 27 mmol/L (ref 22–32)
Creatinine, Ser: 0.77 mg/dL (ref 0.44–1.00)
Glucose, Bld: 126 mg/dL — ABNORMAL HIGH (ref 65–99)
Potassium: 4.2 mmol/L (ref 3.5–5.1)
SODIUM: 133 mmol/L — AB (ref 135–145)

## 2016-06-18 MED ORDER — SODIUM CHLORIDE 0.9 % IV BOLUS (SEPSIS)
1000.0000 mL | Freq: Once | INTRAVENOUS | Status: AC
  Administered 2016-06-18: 1000 mL via INTRAVENOUS

## 2016-06-18 MED ORDER — MORPHINE SULFATE (PF) 4 MG/ML IV SOLN
4.0000 mg | INTRAVENOUS | Status: DC | PRN
  Administered 2016-06-18 – 2016-06-24 (×12): 4 mg via INTRAVENOUS
  Filled 2016-06-18 (×12): qty 1

## 2016-06-18 MED ORDER — KETOROLAC TROMETHAMINE 30 MG/ML IJ SOLN: 30.0000 mg | Freq: Once | INTRAMUSCULAR | Status: DC

## 2016-06-18 MED ORDER — ENOXAPARIN SODIUM 40 MG/0.4ML ~~LOC~~ SOLN
SUBCUTANEOUS | Status: AC
  Administered 2016-06-18: 40 mg via SUBCUTANEOUS
  Filled 2016-06-18: qty 0.4

## 2016-06-18 MED ORDER — HYDRALAZINE HCL 20 MG/ML IJ SOLN: 10.0000 mg | INTRAMUSCULAR | Status: DC | PRN

## 2016-06-18 MED ORDER — KETOROLAC TROMETHAMINE 30 MG/ML IJ SOLN
30.0000 mg | Freq: Four times a day (QID) | INTRAMUSCULAR | Status: DC | PRN
  Administered 2016-06-18 – 2016-06-19 (×2): 30 mg via INTRAVENOUS
  Filled 2016-06-18 (×2): qty 1

## 2016-06-18 MED ORDER — DEXTROSE IN LACTATED RINGERS 5 % IV SOLN
INTRAVENOUS | Status: DC
  Administered 2016-06-18: 1000 mL via INTRAVENOUS
  Administered 2016-06-19 – 2016-06-25 (×14): via INTRAVENOUS

## 2016-06-18 MED ORDER — PIPERACILLIN-TAZOBACTAM 3.375 G IVPB
INTRAVENOUS | Status: AC
  Administered 2016-06-18: 3.375 g via INTRAVENOUS
  Filled 2016-06-18: qty 50

## 2016-06-18 MED ORDER — ONDANSETRON HCL 4 MG/2ML IJ SOLN
4.0000 mg | Freq: Four times a day (QID) | INTRAMUSCULAR | Status: DC | PRN
  Administered 2016-06-19 – 2016-06-20 (×2): 4 mg via INTRAVENOUS
  Filled 2016-06-18 (×2): qty 2

## 2016-06-18 MED ORDER — PIPERACILLIN-TAZOBACTAM 3.375 G IVPB
3.3750 g | Freq: Three times a day (TID) | INTRAVENOUS | Status: DC
  Administered 2016-06-18 – 2016-06-25 (×20): 3.375 g via INTRAVENOUS
  Filled 2016-06-18 (×19): qty 50

## 2016-06-18 MED ORDER — DIPHENHYDRAMINE HCL 12.5 MG/5ML PO ELIX: 12.5000 mg | ORAL_SOLUTION | Freq: Four times a day (QID) | ORAL | Status: DC | PRN

## 2016-06-18 MED ORDER — ACETAMINOPHEN 325 MG PO TABS
650.0000 mg | ORAL_TABLET | Freq: Four times a day (QID) | ORAL | Status: DC | PRN
  Administered 2016-06-19 – 2016-06-20 (×3): 650 mg via ORAL
  Filled 2016-06-18 (×3): qty 2

## 2016-06-18 MED ORDER — OXYCODONE-ACETAMINOPHEN 5-325 MG PO TABS
2.0000 | ORAL_TABLET | Freq: Once | ORAL | Status: AC
  Administered 2016-06-18: 2 via ORAL
  Filled 2016-06-18: qty 2

## 2016-06-18 MED ORDER — ACETAMINOPHEN 325 MG RE SUPP
650.0000 mg | Freq: Four times a day (QID) | RECTAL | Status: DC | PRN
  Filled 2016-06-18: qty 2

## 2016-06-18 MED ORDER — ONDANSETRON 4 MG PO TBDP: 4.0000 mg | ORAL_TABLET | Freq: Four times a day (QID) | ORAL | Status: DC | PRN

## 2016-06-18 MED ORDER — DIPHENHYDRAMINE HCL 50 MG/ML IJ SOLN: 12.5000 mg | Freq: Four times a day (QID) | INTRAMUSCULAR | Status: DC | PRN

## 2016-06-18 MED ORDER — ENOXAPARIN SODIUM 40 MG/0.4ML ~~LOC~~ SOLN
40.0000 mg | SUBCUTANEOUS | Status: DC
  Administered 2016-06-18 – 2016-06-24 (×7): 40 mg via SUBCUTANEOUS
  Filled 2016-06-18 (×7): qty 0.4

## 2016-06-18 MED ORDER — PANTOPRAZOLE SODIUM 40 MG IV SOLR
40.0000 mg | Freq: Every day | INTRAVENOUS | Status: DC
  Administered 2016-06-18 – 2016-06-21 (×4): 40 mg via INTRAVENOUS
  Filled 2016-06-18 (×4): qty 40

## 2016-06-18 NOTE — ED Notes (Signed)
Patient changed into gown.

## 2016-06-18 NOTE — ED Provider Notes (Signed)
[facility] Emergency Department Provider Note   ____________________________________________   First MD Initiated Contact with Patient 06/18/16 1712     (approximate)  I have reviewed the triage vital signs and the nursing notes.   HISTORY  Chief Complaint Dysuria    HPI Judith Nelson is a 61 y.o. female presents for evaluation of left lower quadrant abdominal pain and flank pain. Patient states that she was treated 3 days ago with Cipro for UTI and reports the pain is progressively gotten worse. No relief with AZO.   Past Medical History:  Diagnosis Date  . GERD (gastroesophageal reflux disease)   . Hypertension   . Reflux     Patient Active Problem List   Diagnosis Date Noted  . Diverticulitis large intestine 06/18/2016  . Morbid obesity with BMI of 40.0-44.9, adult ([place]) 05/29/2015  . Preventative health care 05/29/2015  . GERD (gastroesophageal reflux disease) 05/29/2015  . URI (upper respiratory infection) 05/29/2015  . Urge incontinence 05/29/2015  . HTN (hypertension) 03/23/2014  . Hyperlipidemia 03/23/2014    Past Surgical History:  Procedure Laterality Date  . BUNIONECTOMY    . PLANTAR FASCIA RELEASE Right 2010  . TUBAL LIGATION      Prior to Admission medications   Medication Sig Start Date End Date Taking? Authorizing Provider  ciprofloxacin (CIPRO) 500 MG tablet Take 500 mg by mouth 2 (two) times daily.   Yes Historical Provider, MD  Cholecalciferol (VITAMIN D PO) Take 5,000 Units by mouth daily.    Historical Provider, MD  Cyanocobalamin (VITAMIN B 12 PO) 5000 mcg daily    Historical Provider, MD  dextromethorphan-guaiFENesin (MUCINEX DM) 30-600 MG per 12 hr tablet Take 1 tablet by mouth every 12 (twelve) hours.    Historical Provider, MD  esomeprazole (NEXIUM) 40 MG capsule Take 40 mg by mouth daily at 12 noon.    Historical Provider, MD  loratadine (CLARITIN) 10 MG tablet One daily as needed for itching and sneezing  04/06/12   Tanda Rockers, MD  Multiple Vitamin (MULTIVITAMIN) capsule Take 1 capsule by mouth daily.    Historical Provider, MD  Omega-3 Fatty Acids (FISH OIL) 1000 MG CAPS Take by mouth.    Historical Provider, MD  tolterodine (DETROL LA) 4 MG 24 hr capsule Take 1 capsule (4 mg total) by mouth daily. 05/29/15   Coral Spikes, DO    Allergies Hydrocodone-chlorpheniramine and Sulfa antibiotics  Family History  Problem Relation Age of Onset  . Hypertension Mother   . Hyperlipidemia Mother   . Heart disease Mother     CABG & valve replacement   . Diabetes Mother   . Hypertension Father   . Hyperlipidemia Sister   . Hypertension Sister   . Hypertension Sister   . Hyperlipidemia Sister   . Hypertension Sister   . Hyperlipidemia Sister     Social History Social History  Substance Use Topics  . Smoking status: Former Smoker    Packs/day: 2.00    Years: 15.00    Types: Cigarettes    Quit date: 05/25/1988  . Smokeless tobacco: Never Used  . Alcohol use 1.8 oz/week    2 Glasses of wine, 1 Cans of beer per week    Review of Systems Constitutional: No fever/chills ENT: No sore throat. Cardiovascular: Denies chest pain. Respiratory: Denies shortness of breath. Gastrointestinal: Positive abdominal pain.  No nausea, no vomiting.  No diarrhea.  No constipation. Genitourinary: Positive for dysuria. Musculoskeletal: Negative for back pain. Skin: Negative  for rash. Neurological: Negative for headaches, focal weakness or numbness.  10-point ROS otherwise negative.  ____________________________________________   PHYSICAL EXAM:  VITAL SIGNS: ED Triage Vitals  Enc Vitals Group     BP 06/18/16 1650 (!) 191/77     Pulse Rate 06/18/16 1650 96     Resp 06/18/16 1650 18     Temp 06/18/16 1650 98.6 F (37 C)     Temp Source 06/18/16 1650 Oral     SpO2 06/18/16 1650 98 %     Weight --      Height --      Head Circumference --      Peak Flow --      Pain Score 06/18/16 1646 7      Pain Loc --      Pain Edu? --      Excl. in [place]? --     Constitutional: Alert and oriented. Well appearing and in no acute distress. Head: Atraumatic. Nose: No congestion/rhinnorhea. Mouth/Throat: Mucous membranes are moist.  Oropharynx non-erythematous. Neck: No stridor. Supple, full range of motion, nontender.  Cardiovascular: Normal rate, regular rhythm. Grossly normal heart sounds.  Good peripheral circulation. Respiratory: Normal respiratory effort.  No retractions. Lungs CTAB. Gastrointestinal: Soft and tender. No distention. No abdominal bruits. Positive for left-sided CVA tenderness. Musculoskeletal: No lower extremity tenderness nor edema.  No joint effusions. Neurologic:  Normal speech and language. No gross focal neurologic deficits are appreciated. No gait instability. Skin:  Skin is warm, dry and intact. No rash noted. Psychiatric: Mood and affect are normal. Speech and behavior are normal.  ____________________________________________   LABS (all labs ordered are listed, but only abnormal results are displayed)  Labs Reviewed  URINALYSIS, COMPLETE (UACMP) WITH MICROSCOPIC - Abnormal; Notable for the following:       Result Value   Color, Urine AMBER (*)    APPearance CLEAR (*)    Specific Gravity, Urine 1.003 (*)    Hgb urine dipstick SMALL (*)    Nitrite POSITIVE (*)    Squamous Epithelial / LPF 0-5 (*)    All other components within normal limits  BASIC METABOLIC PANEL - Abnormal; Notable for the following:    Sodium 133 (*)    Chloride 97 (*)    Glucose, Bld 126 (*)    All other components within normal limits  CBC WITH DIFFERENTIAL/PLATELET - Abnormal; Notable for the following:    WBC 21.4 (*)    Neutro Abs 19.0 (*)    All other components within normal limits  CBC  CREATININE, SERUM  BASIC METABOLIC PANEL  CBC   ____________________________________________  EKG   ____________________________________________  RADIOLOGY  IMPRESSION:  Findings  most compatible with acute sigmoid diverticulitis. Small  foci of gas adjacent to the sigmoid colon may represent micro  perforation. No evidence for adjacent abscess formation.    The left ovary is enlarged and contains central high attenuation.  Findings may represent a hemorrhagic cyst. In the nonacute setting,  recommend correlation with pelvic ultrasound.    Peripheral high attenuation about the urethra which is nonspecific  on noncontrast enhanced exam however may represent proteinaceous  material/stone within a urethral diverticulum. Recommend nonemergent  urologic consultation and possible MRI as clinically indicated.    Hepatic steatosis.    ____________________________________________   PROCEDURES  Procedure(s) performed: None  Procedures  Critical Care performed: No  ____________________________________________   INITIAL IMPRESSION / ASSESSMENT AND PLAN / ED COURSE  Pertinent labs & imaging results that were available  during my care of the patient were reviewed by me and considered in my medical decision making (see chart for details).  2 diverticulitis with small perforation possible. Surgery consult placed surgeon on-call to see the patient. Patient to be admitted.      ____________________________________________   FINAL CLINICAL IMPRESSION(S) / ED DIAGNOSES  Final diagnoses:  Diverticulitis of large intestine with perforation without bleeding      NEW MEDICATIONS STARTED DURING THIS VISIT:  New Prescriptions   No medications on file     Note:  This document was prepared using Dragon voice recognition software and may include unintentional dictation errors.   Arlyss Repress, PA-C 06/18/16 1909    Nance Pear, MD 06/18/16 [phone]

## 2016-06-18 NOTE — ED Triage Notes (Signed)
Per EMS report, patient had been recently diagnosed with a UTI and is c/o LLQ pain.

## 2016-06-18 NOTE — ED Triage Notes (Signed)
Patient presents to ED via POV in wheelchair from home with c/o dysuria. Patient was dx with uti on Tuesday, given cipro. Last night patient states the pain got really bad. Today she began taking azo with little relief. Patient A&O x4.

## 2016-06-18 NOTE — H&P (Signed)
Patient ID: ALANA BOZZELLI, female   DOB: 10-25-1955, 61 y.o.   MRN: [id number]  HPI EMERSYN FRIAS is a 61 y.o. female  to see by the emergency department. She started complaining of urinary tract symptoms 2 days ago with increased urinary frequency. She went to her primary care provider and was given antibiotics for a UTI. Now she started developing suprapubic discomfort and yesterday started developing left lower quadrant pain. The pain was intermittent and colicky type. She apparently had a low-grade temperature. And some decreased appetite. No emesis no fevers. Apparently the patient has had similar episodes in the past but there did not require any hospitalization. Her only abdominal series is a tubal ligation.. Further workup included a CT scan that I have personally review showing evidence of diverticulitis with a microperforation. No evidence of distant free air and and the microperforation only showed a few bubbles. There is no evidence of an abscess. And the left ovary shows a complex cyst. Her white count is 21,000 with a left shift  HPI  Past Medical History:  Diagnosis Date  . GERD (gastroesophageal reflux disease)   . Hypertension   . Reflux     Past Surgical History:  Procedure Laterality Date  . BUNIONECTOMY    . PLANTAR FASCIA RELEASE Right 2010  . TUBAL LIGATION      Family History  Problem Relation Age of Onset  . Hypertension Mother   . Hyperlipidemia Mother   . Heart disease Mother     CABG & valve replacement   . Diabetes Mother   . Hypertension Father   . Hyperlipidemia Sister   . Hypertension Sister   . Hypertension Sister   . Hyperlipidemia Sister   . Hypertension Sister   . Hyperlipidemia Sister     Social History Social History  Substance Use Topics  . Smoking status: Former Smoker    Packs/day: 2.00    Years: 15.00    Types: Cigarettes    Quit date: 05/25/1988  . Smokeless tobacco: Never Used  . Alcohol use 1.8 oz/week    2 Glasses of wine, 1  Cans of beer per week    Allergies  Allergen Reactions  . Hydrocodone-Chlorpheniramine Other (See Comments)    Insomnia   . Sulfa Antibiotics     Fever- had reaction as a child    No current facility-administered medications for this encounter.    Current Outpatient Prescriptions  Medication Sig Dispense Refill  . ciprofloxacin (CIPRO) 500 MG tablet Take 500 mg by mouth 2 (two) times daily.    . Cholecalciferol (VITAMIN D PO) Take 5,000 Units by mouth daily.    . Cyanocobalamin (VITAMIN B 12 PO) 5000 mcg daily    . dextromethorphan-guaiFENesin (MUCINEX DM) 30-600 MG per 12 hr tablet Take 1 tablet by mouth every 12 (twelve) hours.    Marland Kitchen esomeprazole (NEXIUM) 40 MG capsule Take 40 mg by mouth daily at 12 noon.    . loratadine (CLARITIN) 10 MG tablet One daily as needed for itching and sneezing 30 tablet   . Multiple Vitamin (MULTIVITAMIN) capsule Take 1 capsule by mouth daily.    . Omega-3 Fatty Acids (FISH OIL) 1000 MG CAPS Take by mouth.    . tolterodine (DETROL LA) 4 MG 24 hr capsule Take 1 capsule (4 mg total) by mouth daily. 90 capsule 0     Review of Systems A 10 point review of systems was asked and was negative except for the information on the HPI  Physical Exam Blood pressure (!) 191/77, pulse 96, temperature 98.6 F (37 C), temperature source Oral, resp. rate 18, SpO2 98 %. CONSTITUTIONAL: NAD. Obese EYES: Pupils are equal, round, and reactive to light, Sclera are non-icteric. EARS, NOSE, MOUTH AND THROAT: The oropharynx is clear. The oral mucosa is pink and moist. Hearing is intact to voice. LYMPH NODES:  Lymph nodes in the neck are normal. RESPIRATORY:  Lungs are clear. There is normal respiratory effort, with equal breath sounds bilaterally, and without pathologic use of accessory muscles. CARDIOVASCULAR: Heart is regular without murmurs, gallops, or rubs. GI: The abdomen is soft, Mild TTP LLQ, no peritonitis, no rebound. GU: Rectal deferred.   MUSCULOSKELETAL:  Normal muscle strength and tone. No cyanosis or edema.   SKIN: Turgor is good and there are no pathologic skin lesions or ulcers. NEUROLOGIC: Motor and sensation is grossly normal. Cranial nerves are grossly intact. PSYCH:  Oriented to person, place and time. Affect is normal.  Data Reviewed  I have personally reviewed the patient's imaging, laboratory findings and medical records.    Assessment/Plan   Diverticulitis with microperforation and no evidence of peritonitis. We'll go ahead and admit the patient for IV antibiotics and bowel rest and serial abdominal exams. We'll repeat a CBC and BMP in the morning. Need for any emergent procedures at this time. Discussed with her and that some point in time she will benefit from an colonoscopy and an elective sigmoid colectomy given the fact that she had recurrent episode of diverticulitis and now has had complicated episode. I also explained to her that I she may require further imaging if she does not improve and although very unlikely if she deteriorates may need a Hartman's procedure. She will likely respond to antibiotic therapy and bowel rest and hopefully will keep her and 2-3 days in the hospital.  Caroleen Hamman, MD [place] 06/18/2016, 6:47 PM

## 2016-06-18 NOTE — ED Notes (Signed)
See triage note   States she was seen by her PCP on Tuesday and dx'd with UTI  Placed on cipro  Then she bought some AZO yesterday  States then pain is moving to left flank and abd   Some nausea no fever or vomiting

## 2016-06-19 LAB — CBC
HCT: 37.7 % (ref 35.0–47.0)
HEMOGLOBIN: 13.1 g/dL (ref 12.0–16.0)
MCH: 30.1 pg (ref 26.0–34.0)
MCHC: 34.9 g/dL (ref 32.0–36.0)
MCV: 86.4 fL (ref 80.0–100.0)
Platelets: 182 10*3/uL (ref 150–440)
RBC: 4.37 MIL/uL (ref 3.80–5.20)
RDW: 13.4 % (ref 11.5–14.5)
WBC: 12.2 10*3/uL — ABNORMAL HIGH (ref 3.6–11.0)

## 2016-06-19 LAB — BASIC METABOLIC PANEL
Anion gap: 5 (ref 5–15)
BUN: 9 mg/dL (ref 6–20)
CALCIUM: 8.2 mg/dL — AB (ref 8.9–10.3)
CO2: 25 mmol/L (ref 22–32)
Chloride: 108 mmol/L (ref 101–111)
Creatinine, Ser: 0.61 mg/dL (ref 0.44–1.00)
Glucose, Bld: 118 mg/dL — ABNORMAL HIGH (ref 65–99)
Potassium: 3.6 mmol/L (ref 3.5–5.1)
SODIUM: 138 mmol/L (ref 135–145)

## 2016-06-19 MED ORDER — HYDROMORPHONE HCL 1 MG/ML IJ SOLN
1.0000 mg | INTRAMUSCULAR | Status: DC | PRN
  Administered 2016-06-19 – 2016-06-24 (×8): 1 mg via INTRAVENOUS
  Filled 2016-06-19 (×8): qty 1

## 2016-06-19 MED ORDER — KETOROLAC TROMETHAMINE 30 MG/ML IJ SOLN
30.0000 mg | Freq: Four times a day (QID) | INTRAMUSCULAR | Status: AC
  Administered 2016-06-19 – 2016-06-20 (×4): 30 mg via INTRAVENOUS
  Filled 2016-06-19 (×4): qty 1

## 2016-06-19 MED ORDER — HYDROMORPHONE HCL 1 MG/ML IJ SOLN
0.5000 mg | INTRAMUSCULAR | Status: DC | PRN
  Administered 2016-06-19 (×4): 0.5 mg via INTRAVENOUS
  Filled 2016-06-19 (×4): qty 0.5

## 2016-06-19 NOTE — Progress Notes (Signed)
CC: Diverticulitis Subjective: Patient admitted overnight for diverticulitis with microperforation. Patient reports that she does feel somewhat improved since admission. He is to have some discomfort over her lower abdomen. She denies any nausea or vomiting.  Objective: Vital signs in last 24 hours: Temp:  [98 F (36.7 C)-98.6 F (37 C)] 98.5 F (36.9 C) (01/26 0831) Pulse Rate:  [77-96] 91 (01/26 0831) Resp:  [12-19] 12 (01/26 0831) BP: (134-191)/(52-77) 145/68 (01/26 0831) SpO2:  [95 %-98 %] 95 % (01/26 0831) Weight:  [117.2 kg (258 lb 6.4 oz)] 117.2 kg (258 lb 6.4 oz) (01/25 2032) Last BM Date: 06/18/16  Intake/Output from previous day: 01/25 0701 - 01/26 0700 In: 2431.5 [I.V.:1331.5; IV Piggyback:1100] Out: 2100 [Urine:2100] Intake/Output this shift: Total I/O In: 417.5 [I.V.:417.5] Out: -   Physical exam:  Gen.: No acute distress Chest: Clear to auscultation Heart: Regular rhythm Abdomen: Soft, tender to palpation in bilateral lower quadrants, nondistended.  Lab Results: CBC   Recent Labs  06/18/16 1752 06/19/16 0423  WBC 21.4* 12.2*  HGB 15.3 13.1  HCT 43.1 37.7  PLT 241 182   BMET  Recent Labs  06/18/16 1752 06/19/16 0423  NA 133* 138  K 4.2 3.6  CL 97* 108  CO2 27 25  GLUCOSE 126* 118*  BUN 11 9  CREATININE 0.77 0.61  CALCIUM 9.2 8.2*   PT/INR No results for input(s): LABPROT, INR in the last 72 hours. ABG No results for input(s): PHART, HCO3 in the last 72 hours.  Invalid input(s): PCO2, PO2  Studies/Results: Ct Renal Stone Study  Result Date: 06/18/2016 CLINICAL DATA:  Patient with left flank and abdominal pain. Recent diagnosis urinary tract infection. EXAM: CT ABDOMEN AND PELVIS WITHOUT CONTRAST TECHNIQUE: Multidetector CT imaging of the abdomen and pelvis was performed following the standard protocol without IV contrast. COMPARISON:  None. FINDINGS: Lower chest: Normal cardiac contours. Dependent atelectasis within the bilateral lower  lobes. No pleural effusion. Hepatobiliary: Liver is diffusely low in attenuation compatible with steatosis. Fatty sparing adjacent to the gallbladder fossa. Gallbladder is unremarkable. No intrahepatic or extrahepatic biliary ductal dilatation. Pancreas: Unremarkable Spleen: Unremarkable Adrenals/Urinary Tract: The adrenal glands are normal. Kidneys are mildly lobular in contour. No hydronephrosis. There is peripheral high attenuation about the urethra (image 88; series 2). No nephroureterolithiasis. Stomach/Bowel: The sigmoid colon is decompressed. There is sigmoid colonic diverticulosis. Additionally there is fat stranding and small amount of fluid adjacent to the sigmoid colon with a small foci of gas (image 73; series 2), concerning for acute diverticulitis. Small hiatal hernia. The appendix is normal. Vascular/Lymphatic: Normal caliber abdominal aorta. Peripheral calcified and noncalcified atherosclerotic plaque. No retroperitoneal lymphadenopathy. Reproductive: The left ovary is mildly enlarged and has increased density centrally. The left ovary measures 4.0 x 3.0 cm. There is fat stranding about the left adnexa. Other: Diastases of the rectus abdominus. Musculoskeletal: Lower thoracic and lumbar spine degenerative changes. No aggressive or acute appearing osseous lesions. IMPRESSION: Findings most compatible with acute sigmoid diverticulitis. Small foci of gas adjacent to the sigmoid colon may represent micro perforation. No evidence for adjacent abscess formation. The left ovary is enlarged and contains central high attenuation. Findings may represent a hemorrhagic cyst. In the nonacute setting, recommend correlation with pelvic ultrasound. Peripheral high attenuation about the urethra which is nonspecific on noncontrast enhanced exam however may represent proteinaceous material/stone within a urethral diverticulum. Recommend nonemergent urologic consultation and possible MRI as clinically indicated. Hepatic  steatosis. Electronically Signed   By: Polly Cobia.D.  On: 06/18/2016 18:03    Anti-infectives: Anti-infectives    Start     Dose/Rate Route Frequency Ordered Stop   06/18/16 1915  piperacillin-tazobactam (ZOSYN) IVPB 3.375 g     3.375 g 12.5 mL/hr over 240 Minutes Intravenous Every 8 hours 06/18/16 1906        Assessment/Plan:  61 year old female with diverticulitis. Appears to be responding to IV antibiotics. Had long conversation with the patient that should she fail to progress or not completely improved on antibiotics that she might require an operation. She voiced understanding. Encourage ambulation, incentive spirometer. We will continue on serial exams.  Jayion Schneck T. Adonis Huguenin, MD, FACS  06/19/2016

## 2016-06-20 LAB — CBC
HCT: 37.7 % (ref 35.0–47.0)
Hemoglobin: 12.9 g/dL (ref 12.0–16.0)
MCH: 29.7 pg (ref 26.0–34.0)
MCHC: 34.2 g/dL (ref 32.0–36.0)
MCV: 86.9 fL (ref 80.0–100.0)
PLATELETS: 172 10*3/uL (ref 150–440)
RBC: 4.34 MIL/uL (ref 3.80–5.20)
RDW: 13 % (ref 11.5–14.5)
WBC: 12.1 10*3/uL — AB (ref 3.6–11.0)

## 2016-06-20 LAB — BASIC METABOLIC PANEL
Anion gap: 5 (ref 5–15)
BUN: 5 mg/dL — AB (ref 6–20)
CO2: 29 mmol/L (ref 22–32)
CREATININE: 0.78 mg/dL (ref 0.44–1.00)
Calcium: 8.4 mg/dL — ABNORMAL LOW (ref 8.9–10.3)
Chloride: 104 mmol/L (ref 101–111)
GFR calc Af Amer: >60 mL/min (ref >60)
GFR calc non Af Amer: >60 mL/min (ref >60)
GLUCOSE: 141 mg/dL — AB (ref 65–99)
Potassium: 3.5 mmol/L (ref 3.5–5.1)
Sodium: 138 mmol/L (ref 135–145)

## 2016-06-20 MED ORDER — ACETAMINOPHEN 10 MG/ML IV SOLN
1000.0000 mg | Freq: Four times a day (QID) | INTRAVENOUS | Status: DC | PRN
  Administered 2016-06-20: 1000 mg via INTRAVENOUS
  Filled 2016-06-20 (×3): qty 100

## 2016-06-20 MED ORDER — BUTALBITAL-APAP-CAFFEINE 50-325-40 MG PO TABS
2.0000 | ORAL_TABLET | ORAL | Status: DC | PRN
  Administered 2016-06-20 – 2016-06-21 (×5): 2 via ORAL
  Filled 2016-06-20 (×6): qty 2

## 2016-06-20 NOTE — Progress Notes (Signed)
CC: Diverticulitis Subjective: Patient's primary complaint this morning is that of headache. States her abdominal pain has continued to improve. It has not gone away yet. She is having flatus.  Objective: Vital signs in last 24 hours: Temp:  [97.5 F (36.4 C)-99.3 F (37.4 C)] 97.5 F (36.4 C) (01/27 0800) Pulse Rate:  [81-94] 81 (01/27 0800) Resp:  [17-18] 18 (01/27 0800) BP: (124-147)/(53-70) 145/56 (01/27 0800) SpO2:  [95 %-96 %] 95 % (01/27 0800) Last BM Date: 06/18/16  Intake/Output from previous day: 01/26 0701 - 01/27 0700 In: 1712.5 [I.V.:1662.5; IV Piggyback:50] Out: 4100 [Urine:4100] Intake/Output this shift: Total I/O In: -  Out: 800 [Urine:800]  Physical exam:  Gen.: No acute distress Chest: Clear to auscultation Heart: Regular rhythm Abdomen: Large, soft, minimally tender to palpation in the bilateral lower quadrants, nondistended  Lab Results: CBC   Recent Labs  06/19/16 0423 06/20/16 0614  WBC 12.2* 12.1*  HGB 13.1 12.9  HCT 37.7 37.7  PLT 182 172   BMET  Recent Labs  06/19/16 0423 06/20/16 0614  NA 138 138  K 3.6 3.5  CL 108 104  CO2 25 29  GLUCOSE 118* 141*  BUN 9 5*  CREATININE 0.61 0.78  CALCIUM 8.2* 8.4*   PT/INR No results for input(s): LABPROT, INR in the last 72 hours. ABG No results for input(s): PHART, HCO3 in the last 72 hours.  Invalid input(s): PCO2, PO2  Studies/Results: Ct Renal Stone Study  Result Date: 06/18/2016 CLINICAL DATA:  Patient with left flank and abdominal pain. Recent diagnosis urinary tract infection. EXAM: CT ABDOMEN AND PELVIS WITHOUT CONTRAST TECHNIQUE: Multidetector CT imaging of the abdomen and pelvis was performed following the standard protocol without IV contrast. COMPARISON:  None. FINDINGS: Lower chest: Normal cardiac contours. Dependent atelectasis within the bilateral lower lobes. No pleural effusion. Hepatobiliary: Liver is diffusely low in attenuation compatible with steatosis. Fatty sparing  adjacent to the gallbladder fossa. Gallbladder is unremarkable. No intrahepatic or extrahepatic biliary ductal dilatation. Pancreas: Unremarkable Spleen: Unremarkable Adrenals/Urinary Tract: The adrenal glands are normal. Kidneys are mildly lobular in contour. No hydronephrosis. There is peripheral high attenuation about the urethra (image 88; series 2). No nephroureterolithiasis. Stomach/Bowel: The sigmoid colon is decompressed. There is sigmoid colonic diverticulosis. Additionally there is fat stranding and small amount of fluid adjacent to the sigmoid colon with a small foci of gas (image 73; series 2), concerning for acute diverticulitis. Small hiatal hernia. The appendix is normal. Vascular/Lymphatic: Normal caliber abdominal aorta. Peripheral calcified and noncalcified atherosclerotic plaque. No retroperitoneal lymphadenopathy. Reproductive: The left ovary is mildly enlarged and has increased density centrally. The left ovary measures 4.0 x 3.0 cm. There is fat stranding about the left adnexa. Other: Diastases of the rectus abdominus. Musculoskeletal: Lower thoracic and lumbar spine degenerative changes. No aggressive or acute appearing osseous lesions. IMPRESSION: Findings most compatible with acute sigmoid diverticulitis. Small foci of gas adjacent to the sigmoid colon may represent micro perforation. No evidence for adjacent abscess formation. The left ovary is enlarged and contains central high attenuation. Findings may represent a hemorrhagic cyst. In the nonacute setting, recommend correlation with pelvic ultrasound. Peripheral high attenuation about the urethra which is nonspecific on noncontrast enhanced exam however may represent proteinaceous material/stone within a urethral diverticulum. Recommend nonemergent urologic consultation and possible MRI as clinically indicated. Hepatic steatosis. Electronically Signed   By: Lovey Newcomer M.D.   On: 06/18/2016 18:03    Anti-infectives: Anti-infectives     Start     Dose/Rate Route  Frequency Ordered Stop   06/18/16 1915  piperacillin-tazobactam (ZOSYN) IVPB 3.375 g     3.375 g 12.5 mL/hr over 240 Minutes Intravenous Every 8 hours 06/18/16 1906        Assessment/Plan:  61 year old female with diverticulitis. Appears to be responding to antibiotics. Her abdominal exam has improved. Primary complaint today is of headache. IV Tylenol ordered as the headache was worsened by the IV narcotic and not helped by the IV Toradol. Plan to await resolution of abdominal pain before starting back on a diet.  Alayjah Boehringer T. Adonis Huguenin, MD, FACS  06/20/2016

## 2016-06-21 ENCOUNTER — Encounter: Payer: Self-pay | Admitting: Radiology

## 2016-06-21 ENCOUNTER — Inpatient Hospital Stay: Payer: BLUE CROSS/BLUE SHIELD

## 2016-06-21 DIAGNOSIS — R1084 Generalized abdominal pain: Secondary | ICD-10-CM

## 2016-06-21 LAB — BASIC METABOLIC PANEL
Anion gap: 7 (ref 5–15)
BUN: 6 mg/dL (ref 6–20)
CHLORIDE: 103 mmol/L (ref 101–111)
CO2: 27 mmol/L (ref 22–32)
Calcium: 8.1 mg/dL — ABNORMAL LOW (ref 8.9–10.3)
Creatinine, Ser: 0.7 mg/dL (ref 0.44–1.00)
GFR calc Af Amer: >60 mL/min (ref >60)
GFR calc non Af Amer: >60 mL/min (ref >60)
GLUCOSE: 114 mg/dL — AB (ref 65–99)
POTASSIUM: 3.2 mmol/L — AB (ref 3.5–5.1)
SODIUM: 137 mmol/L (ref 135–145)

## 2016-06-21 LAB — CBC
HCT: 35.2 % (ref 35.0–47.0)
HEMOGLOBIN: 12.6 g/dL (ref 12.0–16.0)
MCH: 30.6 pg (ref 26.0–34.0)
MCHC: 35.8 g/dL (ref 32.0–36.0)
MCV: 85.6 fL (ref 80.0–100.0)
Platelets: 182 10*3/uL (ref 150–440)
RBC: 4.12 MIL/uL (ref 3.80–5.20)
RDW: 13.2 % (ref 11.5–14.5)
WBC: 10.1 10*3/uL (ref 3.6–11.0)

## 2016-06-21 MED ORDER — IOPAMIDOL (ISOVUE-300) INJECTION 61%
100.0000 mL | Freq: Once | INTRAVENOUS | Status: AC | PRN
  Administered 2016-06-21: 100 mL via INTRAVENOUS

## 2016-06-21 MED ORDER — FESOTERODINE FUMARATE ER 4 MG PO TB24
8.0000 mg | ORAL_TABLET | Freq: Every day | ORAL | Status: DC
  Administered 2016-06-22 – 2016-06-25 (×5): 8 mg via ORAL
  Filled 2016-06-21 (×4): qty 2
  Filled 2016-06-21: qty 1

## 2016-06-21 MED ORDER — KETOROLAC TROMETHAMINE 30 MG/ML IJ SOLN
30.0000 mg | Freq: Four times a day (QID) | INTRAMUSCULAR | Status: DC
  Administered 2016-06-22 – 2016-06-25 (×13): 30 mg via INTRAVENOUS
  Filled 2016-06-21 (×14): qty 1

## 2016-06-21 MED ORDER — ACETAMINOPHEN 500 MG PO TABS
1000.0000 mg | ORAL_TABLET | Freq: Four times a day (QID) | ORAL | Status: DC | PRN
  Administered 2016-06-22 – 2016-06-25 (×4): 1000 mg via ORAL
  Filled 2016-06-21 (×4): qty 2

## 2016-06-21 MED ORDER — IOPAMIDOL (ISOVUE-300) INJECTION 61%
15.0000 mL | INTRAVENOUS | Status: AC
  Administered 2016-06-21 (×2): 15 mL via ORAL

## 2016-06-21 MED ORDER — ACETAMINOPHEN 325 MG PO TABS
650.0000 mg | ORAL_TABLET | Freq: Four times a day (QID) | ORAL | Status: DC | PRN
  Administered 2016-06-21: 650 mg via ORAL

## 2016-06-21 NOTE — Progress Notes (Signed)
CC: Diverticulitis Subjective: Patient reports that her abdominal pain has not improved since yesterday. She is ambulating and passing gas but states that her abdomen continues to hurt.  Objective: Vital signs in last 24 hours: Temp:  [97.7 F (36.5 C)-99.4 F (37.4 C)] 97.7 F (36.5 C) (01/28 0808) Pulse Rate:  [80-94] 82 (01/28 0808) Resp:  [18-22] 18 (01/28 0808) BP: (139-144)/(61-68) 140/68 (01/28 0808) SpO2:  [93 %-97 %] 96 % (01/28 0808) Last BM Date: 06/18/16  Intake/Output from previous day: 01/27 0701 - 01/28 0700 In: 3263 [I.V.:3121.5; IV Piggyback:141.5] Out: 2700 [Urine:2700] Intake/Output this shift: Total I/O In: 208.3 [I.V.:208.3] Out: -   Physical exam:  GEN: No acute distress Chest: Clear to auscultation Heart: Regular rate and rhythm Abdomen: Soft, tender to palpation primarily in the upper abdomen, and nondistended no rebound or guarding  Lab Results: CBC   Recent Labs  06/20/16 0614 06/21/16 0614  WBC 12.1* 10.1  HGB 12.9 12.6  HCT 37.7 35.2  PLT 172 182   BMET  Recent Labs  06/20/16 0614 06/21/16 0614  NA 138 137  K 3.5 3.2*  CL 104 103  CO2 29 27  GLUCOSE 141* 114*  BUN 5* 6  CREATININE 0.78 0.70  CALCIUM 8.4* 8.1*   PT/INR No results for input(s): LABPROT, INR in the last 72 hours. ABG No results for input(s): PHART, HCO3 in the last 72 hours.  Invalid input(s): PCO2, PO2  Studies/Results: No results found.  Anti-infectives: Anti-infectives    Start     Dose/Rate Route Frequency Ordered Stop   06/18/16 1915  piperacillin-tazobactam (ZOSYN) IVPB 3.375 g     3.375 g 12.5 mL/hr over 240 Minutes Intravenous Every 8 hours 06/18/16 1906        Assessment/Plan:  61 year old female admitted with diverticulitis with a microperforation. Although her labs have normalized her abdominal pain has not resolved. Given the microperforation discussion the patient she is at high risk for abscess formation. Plan to repeat CT scan  today looking for possible intra-abdominal abscess. Encourage ambulation, incentive spirometer usage. Keep nothing by mouth until after CT results.  Draxton Luu T. Adonis Huguenin, MD, FACS  06/21/2016

## 2016-06-21 NOTE — Progress Notes (Signed)
Pt spiked low grade temp this evening 100.7 oral. Dr. Adonis Huguenin notified. Order to give 650mg  PO tylenol Q 6 PRN for fever received.

## 2016-06-21 NOTE — Progress Notes (Signed)
Pt complaining of 6/10 cramping pain in her RLQ and LLQ. Pt states it feels like she has to pass gas. Dr. Adonis Huguenin notified, per MD will notify him again if pain worsens. No new orders received. Will continue to encourage pt to ambulate.

## 2016-06-22 LAB — URINALYSIS, ROUTINE W REFLEX MICROSCOPIC
BILIRUBIN URINE: NEGATIVE
Bacteria, UA: NONE SEEN
Glucose, UA: NEGATIVE mg/dL
KETONES UR: NEGATIVE mg/dL
LEUKOCYTES UA: NEGATIVE
Nitrite: NEGATIVE
PROTEIN: NEGATIVE mg/dL
Specific Gravity, Urine: 1.009 (ref 1.005–1.030)
pH: 8 (ref 5.0–8.0)

## 2016-06-22 MED ORDER — PANTOPRAZOLE SODIUM 40 MG PO TBEC
40.0000 mg | DELAYED_RELEASE_TABLET | Freq: Every day | ORAL | Status: DC
  Administered 2016-06-22 – 2016-06-24 (×3): 40 mg via ORAL
  Filled 2016-06-22 (×3): qty 1

## 2016-06-22 NOTE — Progress Notes (Signed)
CC: Micro-Perforation of diverticulitis Subjective: This patient with a hospital microperforation of her diverticulitis. She feels much better today she had a fever last night states her pain is almost gone she has no nausea vomiting no fevers or chills. She is tolerating a diet but not eating very much. It on a regular diet this morning.  Objective: Vital signs in last 24 hours: Temp:  [97.7 F (36.5 C)-101.1 F (38.4 C)] 99.1 F (37.3 C) (01/29 1320) Pulse Rate:  [81-91] 82 (01/29 1320) Resp:  [16-19] 16 (01/29 1320) BP: (131-138)/(58-73) 132/58 (01/29 1320) SpO2:  [94 %-98 %] 98 % (01/29 1320) Last BM Date: 06/21/16  Intake/Output from previous day: 01/28 0701 - 01/29 0700 In: 2873.3 [P.O.:480; I.V.:2253.3; IV Piggyback:140] Out: -  Intake/Output this shift: Total I/O In: 348.8 [I.V.:348.8] Out: -   Physical exam:  Up and walking awake alert and oriented febrile last night no acute distress  Lab Results: CBC   Recent Labs  06/20/16 0614 06/21/16 0614  WBC 12.1* 10.1  HGB 12.9 12.6  HCT 37.7 35.2  PLT 172 182   BMET  Recent Labs  06/20/16 0614 06/21/16 0614  NA 138 137  K 3.5 3.2*  CL 104 103  CO2 29 27  GLUCOSE 141* 114*  BUN 5* 6  CREATININE 0.78 0.70  CALCIUM 8.4* 8.1*   PT/INR No results for input(s): LABPROT, INR in the last 72 hours. ABG No results for input(s): PHART, HCO3 in the last 72 hours.  Invalid input(s): PCO2, PO2  Studies/Results: Ct Abdomen Pelvis W Contrast  Result Date: 06/21/2016 CLINICAL DATA:  Increased abdominal and pelvic pain. EXAM: CT ABDOMEN AND PELVIS WITH CONTRAST TECHNIQUE: Multidetector CT imaging of the abdomen and pelvis was performed using the standard protocol following bolus administration of intravenous contrast. CONTRAST:  11mL ISOVUE-300 IOPAMIDOL (ISOVUE-300) INJECTION 61% COMPARISON:  06/18/2016 FINDINGS: Lower chest: Linear areas of atelectasis in the lung bases. No effusions. Heart is normal size.  Hepatobiliary: No focal hepatic abnormality. Gallbladder unremarkable. Pancreas: No focal abnormality or ductal dilatation. Spleen: No focal abnormality.  Normal size. Adrenals/Urinary Tract: No adrenal abnormality. No focal renal abnormality. No stones or hydronephrosis. Urinary bladder is unremarkable. Stomach/Bowel: Sigmoid diverticulosis. Stranding around the sigmoid colon is again noted, similar to prior study. Stomach and small bowel decompressed. Vascular/Lymphatic: Aortic and iliac calcifications. No aneurysm or adenopathy. Reproductive: Left ovary remains enlarged with areas of central high density. Surrounding stranding around the left ovary, contiguous with the stranding around the sigmoid colon. Uterus and right ovary unremarkable. Other: Small amount of free fluid in the pelvis, new since prior study. No free air. Musculoskeletal: No acute findings. IMPRESSION: Stranding in the left pelvis this contiguous with the left ovary and sigmoid colon. The left ovary is enlarged, and there is diverticular disease within the sigmoid colon. It is unclear if this could possibly represent two processes, both diverticulitis and inflammatory process within the left ovary, or 1 process within inflammation surrounding both structures. I would recommend further evaluation of the left ovary with pelvic ultrasound to include Doppler study to exclude torsion. Electronically Signed   By: Rolm Baptise M.D.   On: 06/21/2016 12:20    Anti-infectives: Anti-infectives    Start     Dose/Rate Route Frequency Ordered Stop   06/18/16 1915  piperacillin-tazobactam (ZOSYN) IVPB 3.375 g     3.375 g 12.5 mL/hr over 240 Minutes Intravenous Every 8 hours 06/18/16 1906        Assessment/Plan:  CT scan was  reviewed. There was a question about the ovary being involved in this or is a primary source of the process. I believe this is likely diverticulitis the patient is considerably better and that rules out any sign of torsion as  suggested as a possible cause in the CT report. I see no sign of rupture or torsion in this patient. She's being treated with IV antibiotics was febrile last night recommend continuing the IV antibiotics and thence transitioning to oral antibiotics in the near future.  Florene Glen, MD, FACS  06/22/2016

## 2016-06-22 NOTE — Progress Notes (Signed)
Patient doing well today.  She has not requested any prn meds.

## 2016-06-22 NOTE — Progress Notes (Signed)
PHARMACIST - PHYSICIAN COMMUNICATION  CONCERNING: IV to Oral Route Change Policy  RECOMMENDATION: This patient is receiving pantoprazole by the intravenous route.  Based on criteria approved by the Pharmacy and Therapeutics Committee, the intravenous medication(s) is/are being converted to the equivalent oral dose form(s).   DESCRIPTION: These criteria include:  The patient is eating (either orally or via tube) and/or has been taking other orally administered medications for a least 24 hours  The patient has no evidence of active gastrointestinal bleeding or impaired GI absorption (gastrectomy, short bowel, patient on TNA or NPO).  If you have questions about this conversion, please contact the Pharmacy Department  []   [phone] )  [facility] [x]   [phone] )  [facility] []   [phone] )  [facility] []   [phone] )  [facility] []   [phone] )  [place], [facility] 06/22/2016 7:52 AM

## 2016-06-23 MED ORDER — METRONIDAZOLE 500 MG PO TABS: 500.0000 mg | ORAL_TABLET | Freq: Three times a day (TID) | ORAL | 1 refills | Status: DC

## 2016-06-23 MED ORDER — CALCIUM CARBONATE ANTACID 500 MG PO CHEW
1.0000 | CHEWABLE_TABLET | ORAL | Status: DC | PRN
  Administered 2016-06-23: 200 mg via ORAL
  Filled 2016-06-23: qty 1

## 2016-06-23 MED ORDER — CIPROFLOXACIN HCL 500 MG PO TABS: 500.0000 mg | ORAL_TABLET | Freq: Two times a day (BID) | ORAL | 1 refills | Status: DC

## 2016-06-23 MED ORDER — TRAMADOL HCL 50 MG PO TABS: 50.0000 mg | ORAL_TABLET | Freq: Four times a day (QID) | ORAL | 0 refills | Status: DC | PRN

## 2016-06-23 NOTE — Discharge Instructions (Signed)
Resume all home medications. Follow-up with Dr. Burt Knack in 10 days. Regular diet May shower Return to the emergency room should you experience increasing pain or fevers

## 2016-06-23 NOTE — Progress Notes (Signed)
CC: Diverticulitis with microperforation Subjective: Patient states she had a rough night and describes some reflux-like symptoms but also abdominal pain. She had no more fevers or chills however she is nauseated and has not vomited  Objective: Vital signs in last 24 hours: Temp:  [98.2 F (36.8 C)-99.1 F (37.3 C)] 98.2 F (36.8 C) (01/30 0540) Pulse Rate:  [79-82] 82 (01/30 0540) Resp:  [16-18] 18 (01/30 0540) BP: (132-149)/(58-72) 140/67 (01/30 0540) SpO2:  [90 %-98 %] 90 % (01/30 0540) Last BM Date: 06/21/16  Intake/Output from previous day: 01/29 0701 - 01/30 0700 In: 2829.3 [P.O.:960; I.V.:1719.3; IV Piggyback:150] Out: 2 [Urine:1; Stool:1] Intake/Output this shift: No intake/output data recorded.  Physical exam:  Obese female no acute distress vital signs reviewed and stable no longer febrile.  Abdomen is soft but tender in both lower quadrants without peritoneal signs. Calves are nontender  Lab Results: CBC   Recent Labs  06/21/16 0614  WBC 10.1  HGB 12.6  HCT 35.2  PLT 182   BMET  Recent Labs  06/21/16 0614  NA 137  K 3.2*  CL 103  CO2 27  GLUCOSE 114*  BUN 6  CREATININE 0.70  CALCIUM 8.1*   PT/INR No results for input(s): LABPROT, INR in the last 72 hours. ABG No results for input(s): PHART, HCO3 in the last 72 hours.  Invalid input(s): PCO2, PO2  Studies/Results: Ct Abdomen Pelvis W Contrast  Result Date: 06/21/2016 CLINICAL DATA:  Increased abdominal and pelvic pain. EXAM: CT ABDOMEN AND PELVIS WITH CONTRAST TECHNIQUE: Multidetector CT imaging of the abdomen and pelvis was performed using the standard protocol following bolus administration of intravenous contrast. CONTRAST:  146mL ISOVUE-300 IOPAMIDOL (ISOVUE-300) INJECTION 61% COMPARISON:  06/18/2016 FINDINGS: Lower chest: Linear areas of atelectasis in the lung bases. No effusions. Heart is normal size. Hepatobiliary: No focal hepatic abnormality. Gallbladder unremarkable. Pancreas: No  focal abnormality or ductal dilatation. Spleen: No focal abnormality.  Normal size. Adrenals/Urinary Tract: No adrenal abnormality. No focal renal abnormality. No stones or hydronephrosis. Urinary bladder is unremarkable. Stomach/Bowel: Sigmoid diverticulosis. Stranding around the sigmoid colon is again noted, similar to prior study. Stomach and small bowel decompressed. Vascular/Lymphatic: Aortic and iliac calcifications. No aneurysm or adenopathy. Reproductive: Left ovary remains enlarged with areas of central high density. Surrounding stranding around the left ovary, contiguous with the stranding around the sigmoid colon. Uterus and right ovary unremarkable. Other: Small amount of free fluid in the pelvis, new since prior study. No free air. Musculoskeletal: No acute findings. IMPRESSION: Stranding in the left pelvis this contiguous with the left ovary and sigmoid colon. The left ovary is enlarged, and there is diverticular disease within the sigmoid colon. It is unclear if this could possibly represent two processes, both diverticulitis and inflammatory process within the left ovary, or 1 process within inflammation surrounding both structures. I would recommend further evaluation of the left ovary with pelvic ultrasound to include Doppler study to exclude torsion. Electronically Signed   By: Rolm Baptise M.D.   On: 06/21/2016 12:20    Anti-infectives: Anti-infectives    Start     Dose/Rate Route Frequency Ordered Stop   06/23/16 0000  ciprofloxacin (CIPRO) 500 MG tablet     500 mg Oral 2 times daily 06/23/16 0734     06/23/16 0000  metroNIDAZOLE (FLAGYL) 500 MG tablet     500 mg Oral 3 times daily 06/23/16 0734     06/18/16 1915  piperacillin-tazobactam (ZOSYN) IVPB 3.375 g     3.375  g 12.5 mL/hr over 240 Minutes Intravenous Every 8 hours 06/18/16 1906        Assessment/Plan:  No longer febrile but patient experienced more pain and some reflux like symptoms. I'm hesitant to have her discharged  just yet and would like to continue the IV antibiotics until it is clear that she is improving.  Florene Glen, MD, FACS  06/23/2016

## 2016-06-24 LAB — CBC WITH DIFFERENTIAL/PLATELET
Basophils Absolute: 0 10*3/uL (ref 0–0.1)
Basophils Relative: 0 %
EOS ABS: 0.3 10*3/uL (ref 0–0.7)
Eosinophils Relative: 3 %
HEMATOCRIT: 32.8 % — AB (ref 35.0–47.0)
HEMOGLOBIN: 11.7 g/dL — AB (ref 12.0–16.0)
LYMPHS ABS: 1.4 10*3/uL (ref 1.0–3.6)
LYMPHS PCT: 15 %
MCH: 30.3 pg (ref 26.0–34.0)
MCHC: 35.8 g/dL (ref 32.0–36.0)
MCV: 84.7 fL (ref 80.0–100.0)
Monocytes Absolute: 0.7 10*3/uL (ref 0.2–0.9)
Monocytes Relative: 7 %
NEUTROS ABS: 7.2 10*3/uL — AB (ref 1.4–6.5)
NEUTROS PCT: 75 %
Platelets: 254 10*3/uL (ref 150–440)
RBC: 3.88 MIL/uL (ref 3.80–5.20)
RDW: 13.1 % (ref 11.5–14.5)
WBC: 9.7 10*3/uL (ref 3.6–11.0)

## 2016-06-24 LAB — CREATININE, SERUM
Creatinine, Ser: 0.8 mg/dL (ref 0.44–1.00)
GFR calc Af Amer: >60 mL/min (ref >60)
GFR calc non Af Amer: >60 mL/min (ref >60)

## 2016-06-24 NOTE — Progress Notes (Signed)
CC: Left lower quadrant abdominal pain Subjective: This patient with acute diverticulitis is states that her pain is better but she's having pain in the left lower quadrant suprapubic area which is improved. She is tolerating her diet and is passing gas no fevers or chills  Objective: Vital signs in last 24 hours: Temp:  [97.9 F (36.6 C)-98.9 F (37.2 C)] 98.1 F (36.7 C) (01/31 0843) Pulse Rate:  [68-79] 68 (01/31 0843) Resp:  [18-20] 20 (01/31 0843) BP: (126-149)/(46-65) 126/46 (01/31 0843) SpO2:  [96 %-98 %] 98 % (01/31 0843) Last BM Date: 06/22/16  Intake/Output from previous day: 01/30 0701 - 01/31 0700 In: 2506 [P.O.:720; I.V.:1686; IV Piggyback:100] Out: 0  Intake/Output this shift: Total I/O In: 242 [I.V.:220; IV Piggyback:22] Out: -   Physical exam:  No further fevers vital signs are stable Abdomen is soft and minimally distended minimally tender no peritoneal signs  Lab Results: CBC   Recent Labs  06/24/16 0442  WBC 9.7  HGB 11.7*  HCT 32.8*  PLT 254   BMET  Recent Labs  06/24/16 0442  CREATININE 0.80   PT/INR No results for input(s): LABPROT, INR in the last 72 hours. ABG No results for input(s): PHART, HCO3 in the last 72 hours.  Invalid input(s): PCO2, PO2  Studies/Results: No results found.  Anti-infectives: Anti-infectives    Start     Dose/Rate Route Frequency Ordered Stop   06/23/16 0000  ciprofloxacin (CIPRO) 500 MG tablet     500 mg Oral 2 times daily 06/23/16 0734     06/23/16 0000  metroNIDAZOLE (FLAGYL) 500 MG tablet     500 mg Oral 3 times daily 06/23/16 0734     06/18/16 1915  piperacillin-tazobactam (ZOSYN) IVPB 3.375 g     3.375 g 12.5 mL/hr over 240 Minutes Intravenous Every 8 hours 06/18/16 1906        Assessment/Plan:  White blood cell count is normalized and her fevers gone. She continues to have left lower quadrant pain or tenderness and with her recent fever 48 hours ago I would like to continue on IV  antibiotics for another 24 hours and probably be able to discharge her tomorrow should she not continue to improve she might require surgery however. This is discussed with her she understood and agreed with this plan  Florene Glen, MD, FACS  06/24/2016

## 2016-06-25 ENCOUNTER — Telehealth: Payer: Self-pay

## 2016-06-25 LAB — GLUCOSE, CAPILLARY: GLUCOSE-CAPILLARY: 103 mg/dL — AB (ref 65–99)

## 2016-06-25 NOTE — Discharge Summary (Signed)
Physician Discharge Summary  Patient ID: Judith Nelson MRN: [id number] DOB/AGE: 1956-04-19 61 y.o.  Admit date: 06/18/2016 Discharge date: 06/25/2016   Discharge Diagnoses:  Active Problems:   Diverticulitis of large intestine with perforation without bleeding   Generalized abdominal pain   Procedures:None  Hospital Course: Patient doing very well today she is ready for discharge with a diagnosis of acute diverticulitis with microperforation. She is tolerating a diet after being admitted to the hospital for IV antibiotic therapy. She is transition to oral antibiotics with a soft diet and will follow-up in my office in 2 weeks. She may shower and resume normal activities and normal home medications  Consults: None  Disposition: Final discharge disposition not confirmed   Allergies as of 06/25/2016      Reactions   Hydrocodone-chlorpheniramine Other (See Comments)   Insomnia    Sulfa Antibiotics    Fever- had reaction as a child      Medication List    TAKE these medications   baclofen 10 MG tablet Commonly known as:  LIORESAL Take 10 mg by mouth 3 (three) times daily.   calcium carbonate 500 MG chewable tablet Commonly known as:  TUMS - dosed in mg elemental calcium Chew 1 tablet by mouth daily.   ciprofloxacin 500 MG tablet Commonly known as:  CIPRO Take 1 tablet (500 mg total) by mouth 2 (two) times daily.   dextromethorphan-guaiFENesin 30-600 MG 12hr tablet Commonly known as:  MUCINEX DM Take 1 tablet by mouth every 12 (twelve) hours.   esomeprazole 40 MG capsule Commonly known as:  NEXIUM Take 40 mg by mouth daily at 12 noon.   famotidine 20 MG tablet Commonly known as:  PEPCID Take 20 mg by mouth daily.   Fish Oil 1000 MG Caps Take by mouth.   loratadine 10 MG tablet Commonly known as:  CLARITIN One daily as needed for itching and sneezing   metroNIDAZOLE 500 MG tablet Commonly known as:  FLAGYL Take 1 tablet (500 mg total) by mouth 3 (three) times  daily.   multivitamin capsule Take 1 capsule by mouth daily.   phenazopyridine 95 MG tablet Commonly known as:  PYRIDIUM Take 95 mg by mouth 3 (three) times daily as needed for pain.   tolterodine 4 MG 24 hr capsule Commonly known as:  DETROL LA Take 1 capsule (4 mg total) by mouth daily.   traMADol 50 MG tablet Commonly known as:  ULTRAM Take 1 tablet (50 mg total) by mouth every 6 (six) hours as needed.   VITAMIN B 12 PO 5000 mcg daily   VITAMIN D PO Take 5,000 Units by mouth daily.      Follow-up Information    Phoebe Perch, MD Follow up in 2 week(s).   Specialty:  Surgery Contact information: [address] [place] [address] [phone]           Florene Glen, MD, FACS

## 2016-06-25 NOTE — Progress Notes (Signed)
Pt d/c home; d/c instructions reviewed w/ pt; pt understanding was verbalized; IV removed, catheter in tact, gauze dressing applied; all pt questions answered; pt verbalized that all pt belongings were accounted for; pt left unit via wheelchair accompanied by staff 

## 2016-06-25 NOTE — Telephone Encounter (Signed)
FMLA Paperwork has been received and a $25.00 collection fee has been obtained.    

## 2016-06-25 NOTE — Progress Notes (Signed)
CC: Acute diverticulitis Subjective: This a patient with acute diverticulitis. She's feeling much better today and essentially has no pain in her abdomen no nausea or vomiting no chills but did have a low-grade fever last night 100.7 she is tolerating a diet  Objective: Vital signs in last 24 hours: Temp:  [97.9 F (36.6 C)-100.7 F (38.2 C)] 98.2 F (36.8 C) (02/01 0800) Pulse Rate:  [61-81] 61 (02/01 0730) Resp:  [19-22] 19 (02/01 0730) BP: (126-145)/(50-70) 143/69 (02/01 0730) SpO2:  [96 %-99 %] 96 % (02/01 0800) Last BM Date: 06/22/16  Intake/Output from previous day: 01/31 0701 - 02/01 0700 In: 2495 [P.O.:240; I.V.:2133; IV Piggyback:122] Out: -  Intake/Output this shift: No intake/output data recorded.  Physical exam:  Awake alert and oriented vital signs are stable midnight had a temp of 100.7 no acute distress abdomen is soft and much less tender no erythema no peritoneal signs calves are nontender no icterus no jaundice  Lab Results: CBC   Recent Labs  06/24/16 0442  WBC 9.7  HGB 11.7*  HCT 32.8*  PLT 254   BMET  Recent Labs  06/24/16 0442  CREATININE 0.80   PT/INR No results for input(s): LABPROT, INR in the last 72 hours. ABG No results for input(s): PHART, HCO3 in the last 72 hours.  Invalid input(s): PCO2, PO2  Studies/Results: No results found.  Anti-infectives: Anti-infectives    Start     Dose/Rate Route Frequency Ordered Stop   06/23/16 0000  ciprofloxacin (CIPRO) 500 MG tablet     500 mg Oral 2 times daily 06/23/16 0734     06/23/16 0000  metroNIDAZOLE (FLAGYL) 500 MG tablet     500 mg Oral 3 times daily 06/23/16 0734     06/18/16 1915  piperacillin-tazobactam (ZOSYN) IVPB 3.375 g     3.375 g 12.5 mL/hr over 240 Minutes Intravenous Every 8 hours 06/18/16 1906        Assessment/Plan:  Patient doing very well will likely be able to go home today. She will follow-up in my office next week. She is counseled concerning signs of  worsening or return of her diverticulitis that would require a visit to the emergency room.  Florene Glen, MD, FACS  06/25/2016

## 2016-06-30 NOTE — Telephone Encounter (Signed)
Patient's FMLA Form was filled out and faxed.

## 2016-07-07 ENCOUNTER — Telehealth: Payer: Self-pay

## 2016-07-07 NOTE — Telephone Encounter (Signed)
Patient called asking for a note to return to work on Monday 07/13/2016. After reviewing patient's chart, I did not see that the physician had not put her on any type of restrictions. In addition, I asked patient if she had finished her antibiotics and if she was feeling better. She stated that she had finished her antibiotics and feeling well. I told her that I would type up her letter and have it ready for her tomorrow. Patient agreed and had no further questions.

## 2016-07-08 ENCOUNTER — Telehealth: Payer: Self-pay

## 2016-07-08 NOTE — Telephone Encounter (Signed)
Patient came in to the office picking up a letter that she requested to go back to work. However, she wants the dates to be changed. She stated that she wants to go back to work today. I told her that it was fine.  Letter was typed and given to the patient.

## 2016-07-13 ENCOUNTER — Inpatient Hospital Stay: Payer: BLUE CROSS/BLUE SHIELD | Admitting: Surgery

## 2016-07-15 ENCOUNTER — Ambulatory Visit (INDEPENDENT_AMBULATORY_CARE_PROVIDER_SITE_OTHER): Payer: BLUE CROSS/BLUE SHIELD | Admitting: Surgery

## 2016-07-15 ENCOUNTER — Encounter: Payer: Self-pay | Admitting: Surgery

## 2016-07-15 ENCOUNTER — Other Ambulatory Visit: Payer: Self-pay

## 2016-07-15 VITALS — BP 147/84 | HR 62 | Temp 98.0°F | Ht 66.0 in | Wt 245.4 lb

## 2016-07-15 DIAGNOSIS — K5732 Diverticulitis of large intestine without perforation or abscess without bleeding: Secondary | ICD-10-CM

## 2016-07-15 NOTE — Patient Instructions (Addendum)
We have scheduled your Colonoscopy with Dr. Allen Norris in [place] on 09/18/16 at [facility]. Please see your instruction sheet attached. Please pick up your prep at the pharmacy 1 week prior to having the Colonoscopy. We would like for you to follow up with Dr.Cooper as well and that appointment is listed below. Please call our office if you have questions or concerns.

## 2016-07-15 NOTE — Progress Notes (Signed)
Outpatient Surgical Follow Up  07/15/2016  Judith Nelson is an 61 y.o. female.   CC: Acute diverticulitis  HPI: This patient was seen in the hospital with acute diverticulitis and was sent home on oral antibiotics following a course of IV antibiotics. Patient has finished her oral antibiotics and has no complaints of pain. She does have occasional fullness and trouble with constipation periodically and an that is diet related. She has modified her diet considerably and feels much better. She does not really want to consider surgery as she believes at dietary changes have helped her improve considerably. She has not had a colonoscopy in over 10 years.  Past Medical History:  Diagnosis Date  . Allergic rhinitis, seasonal 11/14/2015  . 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 Diverticulitis of large intestine with perforation without bleeding 06/18/2016  . Generalized abdominal pain   . GERD (gastroesophageal reflux disease)   . HTN (hypertension) 03/23/2014  . Hypertension   . Morbid obesity with BMI of 40.0-44.9, adult ([place]) 05/29/2015  . Preventative health care 05/29/2015  . Reflux   . Urge incontinence 05/29/2015  . URI (upper respiratory infection) 05/29/2015    Past Surgical History:  Procedure Laterality Date  . BUNIONECTOMY    . PLANTAR FASCIA RELEASE Right 2010  . TUBAL LIGATION    . TUBAL LIGATION  1991    Family History  Problem Relation Age of Onset  . Hypertension Mother   . Hyperlipidemia Mother   . Heart disease Mother     CABG & valve replacement   . Diabetes Mother   . Hypertension Father   . Hyperlipidemia Sister   . Hypertension Sister   . Hypertension Sister   . Hyperlipidemia Sister   . Hypertension Sister   . Hyperlipidemia Sister     Social History:  reports that she quit smoking about 28 years ago. Her smoking use included Cigarettes. She has a 30.00 pack-year smoking history. She has never used smokeless tobacco. She reports that she drinks about 1.8 oz of  alcohol per week . She reports that she does not use drugs.  Allergies:  Allergies  Allergen Reactions  . Hydrocodone-Chlorpheniramine Other (See Comments)    Insomnia   . Sulfa Antibiotics Other (See Comments)    Fever- had reaction as a child    Medications reviewed.   Review of Systems:   Review of Systems  Constitutional: Negative.   HENT: Negative.   Eyes: Negative.   Respiratory: Negative.   Cardiovascular: Negative.   Gastrointestinal: Negative.   Genitourinary: Negative.   Musculoskeletal: Negative.   Skin: Negative.   Neurological: Negative.   Endo/Heme/Allergies: Negative.   Psychiatric/Behavioral: Negative.      Physical Exam:  BP (!) 147/84   Pulse 62   Temp 98 F (36.7 C) (Oral)   Ht 5\' 6"  (1.676 m)   Wt 245 lb 6.4 oz (111.3 kg)   BMI 39.61 kg/m   Physical Exam  Constitutional: She is oriented to person, place, and time and well-developed, well-nourished, and in no distress. No distress.  HENT:  Head: Normocephalic and atraumatic.  Eyes: Pupils are equal, round, and reactive to light. Right eye exhibits no discharge. Left eye exhibits no discharge. No scleral icterus.  Neck: Normal range of motion.  Cardiovascular: Normal rate, regular rhythm and normal heart sounds.   Pulmonary/Chest: Effort normal and breath sounds normal. No respiratory distress. She has no wheezes.  Abdominal: Soft. She exhibits no distension. There is no tenderness. There is no rebound  and no guarding.  Musculoskeletal: Normal range of motion. She exhibits no edema.  Lymphadenopathy:    She has no cervical adenopathy.  Neurological: She is alert and oriented to person, place, and time.  Skin: Skin is warm and dry. No rash noted. No erythema.  Psychiatric: Mood and affect normal.  Vitals reviewed.     No results found for this or any previous visit (from the past 48 hour(s)). No results found.  Assessment/Plan:  Patient doing very well at this point she wants to  avoid surgery if possible by making dietary changes. I would recommend a colonoscopy to ensure that there is no hidden or occult colon cancer that could be associated with this as she has not had a colonoscopy in 10 years. If that is negative then we can discuss the options of surgery versus dietary changes alone. She is fairly adamant against surgery. But she is also willing to consider it to avoid a colostomy. I do not believe that she needs a colonoscopy in the immediate future but we will schedule her with a GI physician for consultation and once she is completely healed she could have that colonoscopy performed and will follow up with [place] afterwards.  Florene Glen, MD, FACS

## 2016-09-03 ENCOUNTER — Telehealth: Payer: Self-pay

## 2016-09-03 NOTE — Telephone Encounter (Signed)
We scheduled the patient for a colonoscopy with Dr. Allen Norris. Her procedure is on 09/18/16. We never sent the bowel prep to her pharmacy. She said she had something in the hospital that had fructose in it and it really upset her stomach. She is wondering if there is a bowel prep that she can take that doesn't include fructose. (She has never seen Dr. Allen Norris)  Please call patient and advice.

## 2016-09-03 NOTE — Telephone Encounter (Signed)
Please call her back on her work phone number [phone]

## 2016-09-04 ENCOUNTER — Telehealth: Payer: Self-pay

## 2016-09-04 ENCOUNTER — Other Ambulatory Visit: Payer: Self-pay

## 2016-09-04 MED ORDER — NA SULFATE-K SULFATE-MG SULF 17.5-3.13-1.6 GM/177ML PO SOLN: 1.0000 | ORAL | 0 refills | Status: DC

## 2016-09-04 NOTE — Telephone Encounter (Signed)
Patient is concerned because she is having a flare up but her colonoscopy is scheduled for 09/18/16. She is unable to come in the office today but I have scheduled her for 09/07/16 at 11:00 with Dr. Rosana Hoes. She is expecting a phone call from the nurse to her work phone number [phone]. Please call patient and advice.

## 2016-09-04 NOTE — Telephone Encounter (Signed)
Bloating but denies pain completely, Denies nausea or vomiting. Denies fever or chills. Bowel Movements are daily and patient does not feel constipated. States that this is not symptoms that she has had when she had her diverticulitis flare up. She also complains of heart burn and has only been taking her Nexium intermittently. Has only been on Nexium, never been on other PPI. Does still have Gallbladder.

## 2016-09-04 NOTE — Telephone Encounter (Signed)
Spoke with patient to let her know I have forwarded the message to Ginger. I let her know that Dr.Wohl is a Estate agent and that she will meet him the morning prior to her having the Colonoscopy. I provided the number to reach Ginger if needed.

## 2016-09-04 NOTE — Telephone Encounter (Signed)
Rx for Suprep has been sent to pt's pharmacy. Pt stated she still feels like she is having a flare. I have advised her to contact Dr. Antionette Char office to discuss.

## 2016-09-04 NOTE — Telephone Encounter (Signed)
Spoke with Dr. Adonis Huguenin in regards to this patient at this time. He would like for patient to begin taking Nexium every day 1 hour prior to eating. If her bloating is no better on Monday, she is to keep her appointment with Dr. Rosana Hoes. But if this has helped, he would like patient to continue with Colonoscopy scheduled 09/17/16.   Returned phone call to patient. All above was explained. She verbalizes understanding.

## 2016-09-07 ENCOUNTER — Telehealth: Payer: Self-pay

## 2016-09-07 ENCOUNTER — Other Ambulatory Visit: Payer: Self-pay

## 2016-09-07 ENCOUNTER — Ambulatory Visit: Payer: BLUE CROSS/BLUE SHIELD | Admitting: Surgery

## 2016-09-07 NOTE — Telephone Encounter (Signed)
Patient called in this am and states that with taking the Nexium everyday prior to eating, she feels much better and feels that this is the cause of the problems last week. Her appointment today was cancelled. She will call prior to her colonoscopy if her symptoms worsen once again. She is encouraged to keep her appointment with Dr. Burt Knack in May and to call with any other questions or concerns.   She verbalizes understanding.

## 2016-09-14 NOTE — Discharge Instructions (Signed)

## 2016-09-18 ENCOUNTER — Ambulatory Visit
Admission: RE | Admit: 2016-09-18 | Discharge: 2016-09-18 | Disposition: A | Discharge status: Home / Self Care | Payer: BLUE CROSS/BLUE SHIELD | Source: Ambulatory Visit | Attending: Gastroenterology | Admitting: Gastroenterology

## 2016-09-18 ENCOUNTER — Ambulatory Visit: Payer: BLUE CROSS/BLUE SHIELD | Admitting: Anesthesiology

## 2016-09-18 ENCOUNTER — Encounter: Payer: Self-pay | Admitting: Gastroenterology

## 2016-09-18 ENCOUNTER — Encounter
Admission: RE | Disposition: A | Discharge status: Home / Self Care | Payer: Self-pay | Source: Ambulatory Visit | Attending: Gastroenterology

## 2016-09-18 DIAGNOSIS — K219 Gastro-esophageal reflux disease without esophagitis: Secondary | ICD-10-CM | POA: Insufficient documentation

## 2016-09-18 DIAGNOSIS — Z87891 Personal history of nicotine dependence: Secondary | ICD-10-CM | POA: Insufficient documentation

## 2016-09-18 DIAGNOSIS — Z79899 Other long term (current) drug therapy: Secondary | ICD-10-CM | POA: Diagnosis not present

## 2016-09-18 DIAGNOSIS — K5732 Diverticulitis of large intestine without perforation or abscess without bleeding: Secondary | ICD-10-CM | POA: Diagnosis not present

## 2016-09-18 DIAGNOSIS — K640 First degree hemorrhoids: Secondary | ICD-10-CM | POA: Diagnosis not present

## 2016-09-18 DIAGNOSIS — Z6838 Body mass index (BMI) 38.0-38.9, adult: Secondary | ICD-10-CM | POA: Diagnosis not present

## 2016-09-18 DIAGNOSIS — D125 Benign neoplasm of sigmoid colon: Secondary | ICD-10-CM | POA: Insufficient documentation

## 2016-09-18 DIAGNOSIS — K635 Polyp of colon: Secondary | ICD-10-CM

## 2016-09-18 DIAGNOSIS — K5730 Diverticulosis of large intestine without perforation or abscess without bleeding: Secondary | ICD-10-CM | POA: Diagnosis not present

## 2016-09-18 DIAGNOSIS — I10 Essential (primary) hypertension: Secondary | ICD-10-CM | POA: Insufficient documentation

## 2016-09-18 HISTORY — PX: COLONOSCOPY WITH PROPOFOL: SHX5780

## 2016-09-18 HISTORY — PX: POLYPECTOMY: SHX5525

## 2016-09-18 HISTORY — DX: Unspecified chronic bronchitis: J42

## 2016-09-18 SURGERY — COLONOSCOPY WITH PROPOFOL
Anesthesia: Monitor Anesthesia Care

## 2016-09-18 MED ORDER — STERILE WATER FOR IRRIGATION IR SOLN
Status: DC | PRN
  Administered 2016-09-18: 08:00:00

## 2016-09-18 MED ORDER — SODIUM CHLORIDE 0.9 % IV SOLN: INTRAVENOUS | Status: DC

## 2016-09-18 MED ORDER — LACTATED RINGERS IV SOLN
INTRAVENOUS | Status: DC
  Administered 2016-09-18: 07:00:00 via INTRAVENOUS

## 2016-09-18 MED ORDER — LIDOCAINE HCL (CARDIAC) 20 MG/ML IV SOLN
INTRAVENOUS | Status: DC | PRN
  Administered 2016-09-18: 50 mg via INTRAVENOUS

## 2016-09-18 MED ORDER — PROPOFOL 10 MG/ML IV BOLUS
INTRAVENOUS | Status: DC | PRN
  Administered 2016-09-18 (×2): 20 mg via INTRAVENOUS
  Administered 2016-09-18: 10 mg via INTRAVENOUS
  Administered 2016-09-18 (×3): 20 mg via INTRAVENOUS
  Administered 2016-09-18: 70 mg via INTRAVENOUS

## 2016-09-18 SURGICAL SUPPLY — 23 items

## 2016-09-18 NOTE — Anesthesia Preprocedure Evaluation (Signed)
Anesthesia Evaluation    Airway Mallampati: II  TM Distance: >3 FB Neck ROM: Full    Dental   Pulmonary former smoker,    Pulmonary exam normal        Cardiovascular hypertension, Normal cardiovascular exam     Neuro/Psych    GI/Hepatic GERD  ,  Endo/Other    Renal/GU      Musculoskeletal   Abdominal   Peds  Hematology   Anesthesia Other Findings   Reproductive/Obstetrics                             Anesthesia Physical Anesthesia Plan  ASA: III  Anesthesia Plan: MAC   Post-op Pain Management:    Induction: Intravenous  Airway Management Planned:   Additional Equipment:   Intra-op Plan:   Post-operative Plan:   Informed Consent:   Plan Discussed with: Anesthesiologist  Anesthesia Plan Comments:         Anesthesia Quick Evaluation

## 2016-09-18 NOTE — Op Note (Signed)
[facility] Gastroenterology Patient Name: Judith Nelson Procedure Date: 09/18/2016 7:54 AM MRN: [id number] Account #: [id number] Date of Birth: September 20, 1955 Admit Type: Outpatient Age: 61 Room: [facility] OR ROOM 01 Gender: Female Note Status: Finalized Procedure:            Colonoscopy Indications:          Follow-up of diverticulitis Providers:            Lucilla Lame MD, MD Referring MD:         Ane Payment, MD (Referring MD) Medicines:            Propofol per Anesthesia Complications:        No immediate complications. Procedure:            Pre-Anesthesia Assessment:                       - Prior to the procedure, a History and Physical was                        performed, and patient medications and allergies were                        reviewed. The patient's tolerance of previous                        anesthesia was also reviewed. The risks and benefits of                        the procedure and the sedation options and risks were                        discussed with the patient. All questions were                        answered, and informed consent was obtained. Prior                        Anticoagulants: The patient has taken no previous                        anticoagulant or antiplatelet agents. ASA Grade                        Assessment: II - A patient with mild systemic disease.                        After reviewing the risks and benefits, the patient was                        deemed in satisfactory condition to undergo the                        procedure.                       After obtaining informed consent, the colonoscope was                        passed under direct vision. Throughout the procedure,  the patient's blood pressure, pulse, and oxygen                        saturations were monitored continuously. The [place] [id number] [id number]) was introduced through the                         anus and advanced to the the cecum, identified by                        appendiceal orifice and ileocecal valve. The                        colonoscopy was performed without difficulty. The                        patient tolerated the procedure well. The quality of                        the bowel preparation was excellent. Findings:      The perianal and digital rectal examinations were normal.      A 4 mm polyp was found in the sigmoid colon. The polyp was sessile. The       polyp was removed with a cold biopsy forceps. Resection and retrieval       were complete.      Many small-mouthed diverticula were found in the sigmoid colon,       descending colon and transverse colon.      Non-bleeding internal hemorrhoids were found during retroflexion. The       hemorrhoids were Grade I (internal hemorrhoids that do not prolapse). Impression:           - One 4 mm polyp in the sigmoid colon, removed with a                        cold biopsy forceps. Resected and retrieved.                       - Diverticulosis in the sigmoid colon, in the                        descending colon and in the transverse colon.                       - Non-bleeding internal hemorrhoids. Recommendation:       - Discharge patient to home.                       - Resume previous diet.                       - Continue present medications.                       - Repeat colonoscopy in 5 years if polyp adenoma and 10                        years if hyperplastic Procedure Code(s):    --- Professional ---  45380, Colonoscopy, flexible; with biopsy, single or                        multiple Diagnosis Code(s):    --- Professional ---                       K57.32, Diverticulitis of large intestine without                        perforation or abscess without bleeding                       D12.5, Benign neoplasm of sigmoid colon CPT copyright 2016 American Medical Association. All rights  reserved. The codes documented in this report are preliminary and upon coder review may  be revised to meet current compliance requirements. Lucilla Lame MD, MD 09/18/2016 8:22:48 AM This report has been signed electronically. Number of Addenda: 0 Note Initiated On: 09/18/2016 7:54 AM Scope Withdrawal Time: 0 hours 9 minutes 8 seconds  Total Procedure Duration: 0 hours 12 minutes 32 seconds       [facility]

## 2016-09-18 NOTE — Anesthesia Procedure Notes (Signed)
Procedure Name: MAC Performed by: Mayme Genta Pre-anesthesia Checklist: Patient identified, Emergency Drugs available, Suction available, Timeout performed and Patient being monitored Patient Re-evaluated:Patient Re-evaluated prior to inductionOxygen Delivery Method: Nasal cannula Placement Confirmation: positive ETCO2

## 2016-09-18 NOTE — Anesthesia Postprocedure Evaluation (Signed)
Anesthesia Post Note  Patient: Judith Nelson  Procedure(s) Performed: Procedure(s) (LRB): COLONOSCOPY WITH PROPOFOL (N/A) POLYPECTOMY (N/A)  Patient location during evaluation: PACU Anesthesia Type: MAC Level of consciousness: awake and alert Pain management: pain level controlled Vital Signs Assessment: post-procedure vital signs reviewed and stable Respiratory status: spontaneous breathing, nonlabored ventilation, respiratory function stable and patient connected to nasal cannula oxygen Cardiovascular status: stable and blood pressure returned to baseline Anesthetic complications: no    Amaryllis Dyke

## 2016-09-18 NOTE — H&P (Signed)
Judith Lame, MD [address] [place], [place] [address] [phone] Fax : [phone]  Primary Care Physician:  Barbaraann Boys, MD Primary Gastroenterologist:  Dr. Allen Norris  Pre-Procedure History & Physical: HPI:  Judith Nelson is a 61 y.o. female is here for an colonoscopy.   Past Medical History:  Diagnosis Date  . Allergic rhinitis, seasonal 11/14/2015  . Chronic bronchitis ([place])   . 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 Diverticulitis of large intestine with perforation without bleeding 06/18/2016  . Generalized abdominal pain   . GERD (gastroesophageal reflux disease)   . HTN (hypertension) 03/23/2014  . Hypertension   . Morbid obesity with BMI of 40.0-44.9, adult ([place]) 05/29/2015  . Preventative health care 05/29/2015  . Reflux   . Urge incontinence 05/29/2015  . URI (upper respiratory infection) 05/29/2015    Past Surgical History:  Procedure Laterality Date  . BUNIONECTOMY    . PLANTAR FASCIA RELEASE Right 2010  . TUBAL LIGATION    . TUBAL LIGATION  1991    Prior to Admission medications   Medication Sig Start Date End Date Taking? Authorizing Provider  Cholecalciferol (VITAMIN D PO) Take 5,000 Units by mouth daily.   Yes Historical Provider, MD  Cyanocobalamin (VITAMIN B 12 PO) 5000 mcg daily   Yes Historical Provider, MD  esomeprazole (NEXIUM) 40 MG capsule Take 40 mg by mouth daily at 12 noon.   Yes Historical Provider, MD  Glucosamine Sulfate 1000 MG TABS Take 2 tablets by mouth every morning.   Yes Historical Provider, MD  Multiple Vitamin (MULTIVITAMIN) capsule Take 1 capsule by mouth daily.   Yes Historical Provider, MD  Na Sulfate-K Sulfate-Mg Sulf (SUPREP BOWEL PREP KIT) 17.5-3.13-1.6 GM/180ML SOLN Take 1 kit by mouth as directed. 09/04/16  Yes Judith Lame, MD  Probiotic Product (PROBIOTIC DAILY PO) Take by mouth.   Yes Historical Provider, MD  TOVIAZ 4 MG TB24 tablet Take 4 mg by mouth daily. 08/21/16  Yes Historical Provider, MD  loratadine  (CLARITIN) 10 MG tablet One daily as needed for itching and sneezing Patient not taking: Reported on 09/18/2016 04/06/12   Tanda Rockers, MD  meloxicam (MOBIC) 15 MG tablet Take 1 tablet by mouth 1 day or 1 dose. 06/04/16   Historical Provider, MD  metroNIDAZOLE (FLAGYL) 500 MG tablet Take 1 tablet by mouth 1 day or 1 dose. 06/23/16   Historical Provider, MD  traMADol (ULTRAM) 50 MG tablet Take 1 tablet by mouth Nightly.    Historical Provider, MD    Allergies as of 07/15/2016 - Review Complete 07/15/2016  Allergen Reaction Noted  . Hydrocodone-chlorpheniramine Other (See Comments) 05/29/2015  . Sulfa antibiotics Other (See Comments) 04/06/2012    Family History  Problem Relation Age of Onset  . Hypertension Mother   . Hyperlipidemia Mother   . Heart disease Mother     CABG & valve replacement   . Diabetes Mother   . Hypertension Father   . Hyperlipidemia Sister   . Hypertension Sister   . Hypertension Sister   . Hyperlipidemia Sister   . Hypertension Sister   . Hyperlipidemia Sister     Social History   Social History  . Marital status: Married    Spouse name: N/A  . Number of children: N/A  . Years of education: N/A   Occupational History  . Program Assistant    Social History Main Topics  . Smoking status: Former Smoker    Packs/day: 2.00    Years: 15.00  Types: Cigarettes    Quit date: 05/25/1988  . Smokeless tobacco: Never Used  . Alcohol use 1.8 oz/week    2 Glasses of wine, 1 Cans of beer per week  . Drug use: No  . Sexual activity: Not Currently   Other Topics Concern  . Not on file   Social History Narrative  . No narrative on file    Review of Systems: See HPI, otherwise negative ROS  Physical Exam: Pulse 72   Temp 97.3 F (36.3 C)   Resp 16   Ht '5\' 7"'  (1.702 m)   Wt 243 lb (110.2 kg)   SpO2 99%   BMI 38.06 kg/m  General:   Alert,  pleasant and cooperative in NAD Head:  Normocephalic and atraumatic. Neck:  Supple; no masses or  thyromegaly. Lungs:  Clear throughout to auscultation.    Heart:  Regular rate and rhythm. Abdomen:  Soft, nontender and nondistended. Normal bowel sounds, without guarding, and without rebound.   Neurologic:  Alert and  oriented x4;  grossly normal neurologically.  Impression/Plan: Judith Nelson is here for an colonoscopy to be performed for diverticulitis  Risks, benefits, limitations, and alternatives regarding  colonoscopy have been reviewed with the patient.  Questions have been answered.  All parties agreeable.   Judith Lame, MD  09/18/2016, 7:59 AM

## 2016-09-18 NOTE — Transfer of Care (Signed)
Immediate Anesthesia Transfer of Care Note  Patient: Judith Nelson  Procedure(s) Performed: Procedure(s): COLONOSCOPY WITH PROPOFOL (N/A) POLYPECTOMY (N/A)  Patient Location: PACU  Anesthesia Type: MAC  Level of Consciousness: awake, alert  and patient cooperative  Airway and Oxygen Therapy: Patient Spontanous Breathing and Patient connected to supplemental oxygen  Post-op Assessment: Post-op Vital signs reviewed, Patient's Cardiovascular Status Stable, Respiratory Function Stable, Patent Airway and No signs of Nausea or vomiting  Post-op Vital Signs: Reviewed and stable  Complications: No apparent anesthesia complications

## 2016-09-22 ENCOUNTER — Encounter: Payer: Self-pay | Admitting: Gastroenterology

## 2016-09-24 ENCOUNTER — Ambulatory Visit: Payer: BLUE CROSS/BLUE SHIELD | Admitting: Surgery

## 2016-10-07 ENCOUNTER — Ambulatory Visit: Payer: Self-pay | Admitting: Medical

## 2016-10-07 VITALS — BP 150/90 | HR 89 | Temp 99.3°F | Resp 16 | Ht 67.0 in | Wt 246.0 lb

## 2016-10-07 DIAGNOSIS — N3001 Acute cystitis with hematuria: Secondary | ICD-10-CM

## 2016-10-07 MED ORDER — CIPROFLOXACIN HCL 500 MG PO TABS: 500.0000 mg | ORAL_TABLET | Freq: Two times a day (BID) | ORAL | 0 refills | Status: DC

## 2016-10-07 NOTE — Progress Notes (Signed)
   Subjective:    Patient ID: Judith Nelson, female    DOB: 06/15/1955, 61 y.o.   MRN: [id number]  HPI  Started 2 days ago,  pressure , urgency and frequency, pain on urination and incontinence        ( which she was having before). Suprapubic pain. Back aching. Felt hot but no chills.   Review of Systems  Constitutional: Positive for fever. Negative for chills.  HENT: Negative.   Eyes: Negative.   Respiratory: Negative.   Cardiovascular: Negative.   Gastrointestinal: Negative for diarrhea, nausea and vomiting.  Genitourinary: Positive for dysuria, frequency, pelvic pain and urgency. Negative for hematuria.  Musculoskeletal: Positive for back pain.  Skin: Negative for rash.  Allergic/Immunologic: Negative for environmental allergies and food allergies.  Neurological: Negative for dizziness and syncope.  Hematological: Does not bruise/bleed easily.  Psychiatric/Behavioral: Negative for confusion and hallucinations.       Objective:   Physical Exam  Constitutional: She is oriented to person, place, and time. She appears well-developed and well-nourished.  HENT:  Head: Normocephalic and atraumatic.  Eyes: Conjunctivae and EOM are normal. Pupils are equal, round, and reactive to light.  Neck: Normal range of motion. Neck supple.  Cardiovascular: Normal rate, regular rhythm and normal heart sounds.   Pulmonary/Chest: Effort normal and breath sounds normal.  Abdominal: Soft. She exhibits no distension and no mass. There is no tenderness. There is no rebound and no guarding.  Musculoskeletal: Normal range of motion.  Lymphadenopathy:    She has no cervical adenopathy.  Neurological: She is alert and oriented to person, place, and time.  Skin: Skin is warm and dry.  Psychiatric: She has a normal mood and affect. Her behavior is normal.    Mild suprapubic pressure with palpation.  no CVA tenderness     Assessment & Plan:  Urinary tract infection e-prescribed Cipro 500mg  one po   twice daily x 7 days. #14 no refill Azo given in clinic 2 tablets lot  [id number] exp 10/19 Return to clinic in  3-5 days if not imrproving. Urine culture sent off. Urgent care or ER if worsening over the weekend.

## 2016-10-10 LAB — URINE CULTURE

## 2016-11-30 ENCOUNTER — Encounter: Payer: Self-pay | Admitting: Medical

## 2016-11-30 ENCOUNTER — Ambulatory Visit: Payer: Self-pay | Admitting: Medical

## 2016-11-30 VITALS — BP 140/78 | HR 69 | Temp 98.0°F | Resp 18 | Ht 67.0 in | Wt 255.0 lb

## 2016-11-30 DIAGNOSIS — R300 Dysuria: Secondary | ICD-10-CM

## 2016-11-30 DIAGNOSIS — N390 Urinary tract infection, site not specified: Secondary | ICD-10-CM

## 2016-11-30 DIAGNOSIS — R319 Hematuria, unspecified: Principal | ICD-10-CM

## 2016-11-30 LAB — POCT URINALYSIS DIPSTICK
Bilirubin, UA: NEGATIVE
GLUCOSE UA: NEGATIVE
Ketones, UA: NEGATIVE
PH UA: 5 (ref 5.0–8.0)
Spec Grav, UA: 1.015 (ref 1.010–1.025)
UROBILINOGEN UA: 0.2 U/dL

## 2016-11-30 MED ORDER — NITROFURANTOIN MONOHYD MACRO 100 MG PO CAPS: 100.0000 mg | ORAL_CAPSULE | Freq: Two times a day (BID) | ORAL | 0 refills | Status: DC

## 2016-11-30 NOTE — Progress Notes (Signed)
   Subjective:    Patient ID: Judith Nelson, female    DOB: 1956/01/22, 61 y.o.   MRN: [id number]  HPI  61 yo female comes in with pain at end of urination starting at  6am, Has Fequency and urgency. Denies fever, or back pain.      Review of Systems  Constitutional: Negative for chills and fever.  Respiratory: Positive for cough.   Cardiovascular: Negative for chest pain.  Gastrointestinal: Negative for abdominal pain.  Genitourinary: Positive for dysuria, frequency, hematuria and urgency.  Musculoskeletal: Positive for back pain.  Skin: Negative for rash.  Neurological: Negative for dizziness and syncope.   Bacterial vaginosis diagnosed on June  1st.  Has a history of incontinence and has an appointment with Urology on June 25th.   took mucinex for clear productive cough x 2 days, now better. But still has a mile cough.  mild back pain starting this morning. Having phelgm in throat. Objective:   Physical Exam  Constitutional: She appears well-developed and well-nourished.  HENT:  Head: Normocephalic and atraumatic.  Eyes: Conjunctivae and EOM are normal. Pupils are equal, round, and reactive to light.  Neck: Normal range of motion. Neck supple.  Abdominal: Soft. Bowel sounds are normal.  Nursing note and vitals reviewed.  Urine dip moderate amount of leuks, large amount of blood. No CVA tenderness, mild suprapubic pain on palpation.    Assessment & Plan:  Urinary tract Infection was on Cipro in May 2018 for UTI. E-prescribed Macrobid 100mg  one tablet by mouth twice dialy for  Seven days, #14 no refills. Increase water intake. Sent urine off for culture.  Patient to return to the clinic in  3 days if not improving. To use OTC Azo  Take as directed for pain and bladder spasms. Will contact patient once I get the results of culture. Given AZO in clinic,  2 tablets Lot  [id number] 10/19 6847

## 2016-11-30 NOTE — Patient Instructions (Signed)
urinary Urinary Tract Infection, Adult A urinary tract infection (UTI) is an infection of any part of the urinary tract. The urinary tract includes the:  Kidneys.  Ureters.  Bladder.  Urethra.  These organs make, store, and get rid of pee (urine) in the body. Follow these instructions at home:  Take over-the-counter and prescription medicines only as told by your doctor.  If you were prescribed an antibiotic medicine, take it as told by your doctor. Do not stop taking the antibiotic even if you start to feel better.  Avoid the following drinks: ? Alcohol. ? Caffeine. ? Tea. ? Carbonated drinks.  Drink enough fluid to keep your pee clear or pale yellow.  Keep all follow-up visits as told by your doctor. This is important.  Make sure to: ? Empty your bladder often and completely. Do not to hold pee for long periods of time. ? Empty your bladder before and after sex. ? Wipe from front to back after a bowel movement if you are female. Use each tissue one time when you wipe. Contact a doctor if:  You have back pain.  You have a fever.  You feel sick to your stomach (nauseous).  You throw up (vomit).  Your symptoms do not get better after 3 days.  Your symptoms go away and then come back. Get help right away if:  You have very bad back pain.  You have very bad lower belly (abdominal) pain.  You are throwing up and cannot keep down any medicines or water. This information is not intended to replace advice given to you by your health care provider. Make sure you discuss any questions you have with your health care provider. Document Released: 10/28/2007 Document Revised: 10/17/2015 Document Reviewed: 04/01/2015 Elsevier Interactive Patient Education  [employer].

## 2016-12-02 LAB — URINE CULTURE

## 2016-12-04 ENCOUNTER — Telehealth: Payer: Self-pay | Admitting: Adult Health

## 2016-12-04 DIAGNOSIS — N390 Urinary tract infection, site not specified: Secondary | ICD-10-CM

## 2016-12-04 MED ORDER — AMOXICILLIN-POT CLAVULANATE 875-125 MG PO TABS: 1.0000 | ORAL_TABLET | Freq: Two times a day (BID) | ORAL | 0 refills | Status: DC

## 2016-12-04 NOTE — Telephone Encounter (Addendum)
Patient called reports she has been taking Macrobid since Monday 11/30/16 for UTI in office that was diagnosed. Provider reviewed culture results as noted below. Patient denies any nausea, vomiting, chills or diarrhea. Denies any pain or hematuria.  She does report mid very low back pain and urinary urgency that is returning. Macrobid is Intermediate, prescription sent to [facility] for Augmentin.Advised patient to call and schedule a REPEAT URine culture after antibiotic is completed. Patient advised to stop Macrobid.  Patient advised to seek emergency de[partment or urgent care if any symptoms worsen or fail to improve during weekend or after hours. Call office as needed during office hours. Patient verbalized understanding of all instructions.    Component 4d ago  Urine Culture, Routine Final report    Organism ID, Bacteria Klebsiella pneumoniae    Comment: Greater than 100,000 colony forming units per mL  Cefazolin <=4 ug/mL  Cefazolin with an MIC <=16 predicts susceptibility to the oral agents  cefaclor, cefdinir, cefpodoxime, cefprozil, cefuroxime, cephalexin,  and loracarbef when used for therapy of uncomplicated urinary tract  infections due to E. coli, Klebsiella pneumoniae, and Proteus  mirabilis.   Antimicrobial Susceptibility Comment   Comment:   ** S = Susceptible; I = Intermediate; R = Resistant **           P = Positive; N = Negative        MICS are expressed in micrograms per mL   Antibiotic         RSLT#1  RSLT#2  RSLT#3  RSLT#4  Amoxicillin/Clavulanic Acid  S

## 2016-12-16 ENCOUNTER — Encounter: Payer: Self-pay | Admitting: Adult Health

## 2016-12-22 ENCOUNTER — Ambulatory Visit: Payer: Self-pay | Admitting: Medical

## 2016-12-22 VITALS — BP 134/82 | HR 66 | Temp 99.1°F

## 2016-12-22 DIAGNOSIS — N3001 Acute cystitis with hematuria: Secondary | ICD-10-CM

## 2016-12-22 DIAGNOSIS — R300 Dysuria: Secondary | ICD-10-CM

## 2016-12-22 LAB — POCT URINALYSIS DIPSTICK
Bilirubin, UA: NEGATIVE
Glucose, UA: NEGATIVE
KETONES UA: NEGATIVE
NITRITE UA: NEGATIVE
PH UA: 6 (ref 5.0–8.0)
Spec Grav, UA: 1.01 (ref 1.010–1.025)
Urobilinogen, UA: 0.2 E.U./dL

## 2016-12-22 MED ORDER — CEFDINIR 300 MG PO CAPS: 300.0000 mg | ORAL_CAPSULE | Freq: Two times a day (BID) | ORAL | 0 refills | Status: DC

## 2016-12-22 NOTE — Telephone Encounter (Signed)
Pt was in the clinic for a follow up today, a urine sample was sent to [facility] for cx.

## 2016-12-22 NOTE — Patient Instructions (Signed)
urinary Urinary Tract Infection, Adult A urinary tract infection (UTI) is an infection of any part of the urinary tract. The urinary tract includes the:  Kidneys.  Ureters.  Bladder.  Urethra.  These organs make, store, and get rid of pee (urine) in the body. Follow these instructions at home:  Take over-the-counter and prescription medicines only as told by your doctor.  If you were prescribed an antibiotic medicine, take it as told by your doctor. Do not stop taking the antibiotic even if you start to feel better.  Avoid the following drinks: ? Alcohol. ? Caffeine. ? Tea. ? Carbonated drinks.  Drink enough fluid to keep your pee clear or pale yellow.  Keep all follow-up visits as told by your doctor. This is important.  Make sure to: ? Empty your bladder often and completely. Do not to hold pee for long periods of time. ? Empty your bladder before and after sex. ? Wipe from front to back after a bowel movement if you are female. Use each tissue one time when you wipe. Contact a doctor if:  You have back pain.  You have a fever.  You feel sick to your stomach (nauseous).  You throw up (vomit).  Your symptoms do not get better after 3 days.  Your symptoms go away and then come back. Get help right away if:  You have very bad back pain.  You have very bad lower belly (abdominal) pain.  You are throwing up and cannot keep down any medicines or water. This information is not intended to replace advice given to you by your health care provider. Make sure you discuss any questions you have with your health care provider. Document Released: 10/28/2007 Document Revised: 10/17/2015 Document Reviewed: 04/01/2015 Elsevier Interactive Patient Education  [employer].

## 2016-12-22 NOTE — Progress Notes (Signed)
   Subjective:    Patient ID: Judith Nelson, female    DOB: 1955/12/04, 61 y.o.   MRN: [id number]  HPI  61 yo female returns to the clinic finished Augmentin for urinary tract infection (klebsiella pneumoniae) .  Symptoms similar to previous urinary tract infection returned, pressure, pain at the end of urination, frequency, felt like fever and chills all beginning this morning, lower back bilaterally. Diverticulitis/small Perforation without bleeding in intestine in  Jan   2018  Treated with antibiotics Also treated with antibiotics and  taking probiotics per doctor after discharge from the hospital.  Had a colonoscopy in May was told everything looked good. Saw OB/GYN due to incontinence, told she had a vaginal infection treated with Flagyl.  Has some incontinence medication but not taking regularly due to her urinary tract infections. Had a recent Urology consult but cancelled it because of her vaginal infection, thought she did not need to see him.  Review of Systems  Constitutional: Positive for activity change, appetite change, chills, fatigue and fever.  HENT: Positive for postnasal drip and sinus pressure. Negative for ear pain and sore throat.   Eyes: Positive for discharge. Negative for photophobia, pain, redness, itching and visual disturbance.  Respiratory: Negative for cough and shortness of breath.   Cardiovascular: Negative for chest pain and palpitations.  Gastrointestinal: Positive for abdominal distention. Negative for abdominal pain, diarrhea, nausea and vomiting.  Endocrine: Negative for cold intolerance and heat intolerance.  Genitourinary: Positive for dysuria, frequency and urgency. Negative for hematuria.  Musculoskeletal: Positive for back pain.  Skin: Negative for rash.  Neurological: Negative for dizziness, syncope and light-headedness.  Psychiatric/Behavioral: Negative for confusion and hallucinations.      change in weather , gets sinus pressure left side behind  eye. Objective:   Physical Exam  Constitutional: She is oriented to person, place, and time. She appears well-developed and well-nourished.  HENT:  Head: Normocephalic and atraumatic.  Eyes: Pupils are equal, round, and reactive to light. Conjunctivae and EOM are normal.  Neck: Normal range of motion.  Abdominal: Soft. She exhibits no distension and no mass. There is no tenderness. There is no rebound and no guarding.  Musculoskeletal: Normal range of motion.  Neurological: She is alert and oriented to person, place, and time.  Skin: Skin is warm and dry.  Psychiatric: She has a normal mood and affect. Her behavior is normal. Judgment and thought content normal.  Nursing note and vitals reviewed.  No CVA tenderness   UA Lare leucocytes and large blood, trace protein Assessment & Plan:  Urinary tract infection , it may be the same bacteria. Placed on cefdinir 300 mg twice a day for  10 days #20 no refills. Patient states the infection feels the same as before, so will treat as if it is the same bacteria with a longer course of antibiotics.  Urine culture pending. Drink plenty of water, may use OTC Azo Ix 2 days for discomfort , take as directed.  Return to the clinic if no improvement in 3 days or sooner if worsening. Many need to send patient to Urology for repeat urinary tract infections.

## 2016-12-24 LAB — URINE CULTURE: ORGANISM ID, BACTERIA: NO GROWTH

## 2017-01-13 DIAGNOSIS — M170 Bilateral primary osteoarthritis of knee: Secondary | ICD-10-CM | POA: Insufficient documentation

## 2017-01-26 DIAGNOSIS — N3946 Mixed incontinence: Secondary | ICD-10-CM | POA: Insufficient documentation

## 2017-01-28 NOTE — Progress Notes (Signed)
Please place orders in EPIC as patient is being scheduled for a pre-op appointment! Thank you! 

## 2017-01-29 ENCOUNTER — Ambulatory Visit: Payer: Self-pay | Admitting: Orthopedic Surgery

## 2017-01-29 NOTE — H&P (Signed)
TOTAL KNEE ADMISSION H&P  Patient is being admitted for right total knee arthroplasty.  Subjective:  Chief Complaint:right knee pain.  HPI: Judith Nelson, 61 y.o. female, has a history of pain and functional disability in the right knee due to arthritis and has failed non-surgical conservative treatments for greater than 12 weeks to includeNSAID's and/or analgesics, corticosteriod injections, viscosupplementation injections, flexibility and strengthening excercises, use of assistive devices, weight reduction as appropriate and activity modification.  Onset of symptoms was gradual, starting 5 years ago with gradually worsening course since that time. The patient noted no past surgery on the right knee(s).  Patient currently rates pain in the right knee(s) at 10 out of 10 with activity. Patient has night pain, worsening of pain with activity and weight bearing, pain that interferes with activities of daily living, pain with passive range of motion, crepitus and joint swelling.  Patient has evidence of subchondral cysts, subchondral sclerosis, periarticular osteophytes and joint space narrowing by imaging studies. There is no active infection.  Patient Active Problem List   Diagnosis Date Noted  . Diverticulitis of colon (without mention of hemorrhage)(562.11)   . Polyp of sigmoid colon   . Generalized abdominal pain   . Diverticulitis of large intestine with perforation without bleeding 06/18/2016  . Allergic rhinitis, seasonal 11/14/2015  . Morbid obesity with BMI of 40.0-44.9, adult ([place]) 05/29/2015  . Preventative health care 05/29/2015  . GERD (gastroesophageal reflux disease) 05/29/2015  . URI (upper respiratory infection) 05/29/2015  . Urge incontinence 05/29/2015  . 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 HTN (hypertension) 03/23/2014  . Hyperlipidemia 03/23/2014   Past Medical History:  Diagnosis Date  . Allergic rhinitis, seasonal 11/14/2015  . Chronic bronchitis ([place])   . Chronic  hypokalemia 07/19/2014  . Diverticulitis of large intestine with perforation without bleeding 06/18/2016  . Generalized abdominal pain   . GERD (gastroesophageal reflux disease)   . HTN (hypertension) 03/23/2014  . Hypertension   . Morbid obesity with BMI of 40.0-44.9, adult ([place]) 05/29/2015  . Preventative health care 05/29/2015  . Reflux   . Urge incontinence 05/29/2015  . URI (upper respiratory infection) 05/29/2015    Past Surgical History:  Procedure Laterality Date  . BUNIONECTOMY    . COLONOSCOPY WITH PROPOFOL N/A 09/18/2016   Procedure: COLONOSCOPY WITH PROPOFOL;  Surgeon: Lucilla Lame, MD;  Location: [facility];  Service: Endoscopy;  Laterality: N/A;  . PLANTAR FASCIA RELEASE Right 2010  . POLYPECTOMY N/A 09/18/2016   Procedure: POLYPECTOMY;  Surgeon: Lucilla Lame, MD;  Location: [facility];  Service: Gastroenterology;  Laterality: N/A;  . TUBAL LIGATION    . TUBAL LIGATION  1991     (Not in a hospital admission) Allergies  Allergen Reactions  . Hydrocodone-Chlorpheniramine Other (See Comments)    Insomnia   . Sulfa Antibiotics Other (See Comments)    Fever- had reaction as a child    Social History  Substance Use Topics  . Smoking status: Former Smoker    Packs/day: 2.00    Years: 15.00    Types: Cigarettes    Quit date: 05/25/1988  . Smokeless tobacco: Never Used  . Alcohol use 1.8 oz/week    2 Glasses of wine, 1 Cans of beer per week    Family History  Problem Relation Age of Onset  . Hypertension Mother   . Hyperlipidemia Mother   . Heart disease Mother        CABG & valve replacement   . Diabetes Mother   .  Hypertension Father   . Hyperlipidemia Sister   . Hypertension Sister   . Hypertension Sister   . Hyperlipidemia Sister   . Hypertension Sister   . Hyperlipidemia Sister      Review of Systems  Constitutional: Negative.   HENT: Negative.   Eyes: Negative.   Respiratory: Negative.   Cardiovascular: Negative.   Gastrointestinal:  Negative.   Genitourinary: Negative.   Musculoskeletal: Positive for joint pain.  Skin: Negative.   Neurological: Negative.   Endo/Heme/Allergies: Negative.   Psychiatric/Behavioral: Negative.     Objective:  Physical Exam  Vitals reviewed. Constitutional: She is oriented to person, place, and time. She appears well-developed and well-nourished.  HENT:  Head: Normocephalic and atraumatic.  Eyes: Pupils are equal, round, and reactive to light. Conjunctivae and EOM are normal.  Neck: Normal range of motion. Neck supple.  Cardiovascular: Normal rate, normal heart sounds and intact distal pulses.   Respiratory: Effort normal. No respiratory distress.  GI: Soft. She exhibits no distension.  Genitourinary:  Genitourinary Comments: deferred  Musculoskeletal:       Right knee: She exhibits decreased range of motion, swelling, effusion and abnormal alignment. Tenderness found. Medial joint line and lateral joint line tenderness noted.  Neurological: She is alert and oriented to person, place, and time. She has normal reflexes.  Skin: Skin is warm and dry.  Psychiatric: She has a normal mood and affect. Her behavior is normal. Judgment and thought content normal.    Vital signs in last 24 hours: @VSRANGES @  Labs:   Estimated body mass index is 39.94 kg/m as calculated from the following:   Height as of 11/30/16: 5\' 7"  (1.702 m).   Weight as of 11/30/16: 115.7 kg (255 lb).   Imaging Review Plain radiographs demonstrate severe degenerative joint disease of the right knee(s). The overall alignment issignificant varus. The bone quality appears to be adequate for age and reported activity level.  Assessment/Plan:  End stage arthritis, right knee   The patient history, physical examination, clinical judgment of the provider and imaging studies are consistent with end stage degenerative joint disease of the right knee(s) and total knee arthroplasty is deemed medically necessary. The  treatment options including medical management, injection therapy arthroscopy and arthroplasty were discussed at length. The risks and benefits of total knee arthroplasty were presented and reviewed. The risks due to aseptic loosening, infection, stiffness, patella tracking problems, thromboembolic complications and other imponderables were discussed. The patient acknowledged the explanation, agreed to proceed with the plan and consent was signed. Patient is being admitted for inpatient treatment for surgery, pain control, PT, OT, prophylactic antibiotics, VTE prophylaxis, progressive ambulation and ADL's and discharge planning. The patient is planning to be discharged home with outpatient PT

## 2017-02-03 ENCOUNTER — Other Ambulatory Visit (HOSPITAL_COMMUNITY): Payer: Self-pay | Admitting: Emergency Medicine

## 2017-02-03 NOTE — Patient Instructions (Addendum)
KASSADIE PANCAKE  02/03/2017   Your procedure is scheduled on: 02-11-17  Report to [facility] Main  Entrance  Take [place]  elevators to 3rd floor to  [place] at 530AM.   Call this number if you have problems the morning of surgery [phone]    Remember: ONLY 1 PERSON MAY GO WITH YOU TO SHORT STAY TO GET  READY MORNING OF YOUR SURGERY.  Do not eat food or drink liquids :After Midnight.     Take these medicines the morning of surgery with A SIP OF WATER: inhaler as needed (may bring to hospital) , mucinex as needed                                You may not have any metal on your body including hair pins and              piercings  Do not wear jewelry, make-up, lotions, powders or perfumes, deodorant             Do not wear nail polish.  Do not shave  48 hours prior to surgery.     Do not bring valuables to the hospital. [place].  Contacts, dentures or bridgework may not be worn into surgery.  Leave suitcase in the car. After surgery it may be brought to your room.                Please read over the following fact sheets you were given: _____________________________________________________________________  [facility] - Preparing for Surgery Before surgery, you can play an important role.  Because skin is not sterile, your skin needs to be as free of germs as possible.  You can reduce the number of germs on your skin by washing with CHG (chlorahexidine gluconate) soap before surgery.  CHG is an antiseptic cleaner which kills germs and bonds with the skin to continue killing germs even after washing. Please DO NOT use if you have an allergy to CHG or antibacterial soaps.  If your skin becomes reddened/irritated stop using the CHG and inform your nurse when you arrive at Short Stay. Do not shave (including legs and underarms) for at least 48 hours prior to the first CHG shower.  You may shave your  face/neck. Please follow these instructions carefully:  1.  Shower with CHG Soap the night before surgery and the  morning of Surgery.  2.  If you choose to wash your hair, wash your hair first as usual with your  normal  shampoo.  3.  After you shampoo, rinse your hair and body thoroughly to remove the  shampoo.                           4.  Use CHG as you would any other liquid soap.  You can apply chg directly  to the skin and wash                       Gently with a scrungie or clean washcloth.  5.  Apply the CHG Soap to your body ONLY FROM THE NECK DOWN.   Do not use on face/ open  Wound or open sores. Avoid contact with eyes, ears mouth and genitals (private parts).                       Wash face,  Genitals (private parts) with your normal soap.             6.  Wash thoroughly, paying special attention to the area where your surgery  will be performed.  7.  Thoroughly rinse your body with warm water from the neck down.  8.  DO NOT shower/wash with your normal soap after using and rinsing off  the CHG Soap.                9.  Pat yourself dry with a clean towel.            10.  Wear clean pajamas.            11.  Place clean sheets on your bed the night of your first shower and do not  sleep with pets. Day of Surgery : Do not apply any lotions/deodorants the morning of surgery.  Please wear clean clothes to the hospital/surgery center.  FAILURE TO FOLLOW THESE INSTRUCTIONS MAY RESULT IN THE CANCELLATION OF YOUR SURGERY PATIENT SIGNATURE_________________________________  NURSE SIGNATURE__________________________________  ________________________________________________________________________   Adam Phenix  An incentive spirometer is a tool that can help keep your lungs clear and active. This tool measures how well you are filling your lungs with each breath. Taking long deep breaths may help reverse or decrease the chance of developing breathing  (pulmonary) problems (especially infection) following:  A long period of time when you are unable to move or be active. BEFORE THE PROCEDURE   If the spirometer includes an indicator to show your best effort, your nurse or respiratory therapist will set it to a desired goal.  If possible, sit up straight or lean slightly forward. Try not to slouch.  Hold the incentive spirometer in an upright position. INSTRUCTIONS FOR USE  1. Sit on the edge of your bed if possible, or sit up as far as you can in bed or on a chair. 2. Hold the incentive spirometer in an upright position. 3. Breathe out normally. 4. Place the mouthpiece in your mouth and seal your lips tightly around it. 5. Breathe in slowly and as deeply as possible, raising the piston or the ball toward the top of the column. 6. Hold your breath for 3-5 seconds or for as long as possible. Allow the piston or ball to fall to the bottom of the column. 7. Remove the mouthpiece from your mouth and breathe out normally. 8. Rest for a few seconds and repeat Steps 1 through 7 at least 10 times every 1-2 hours when you are awake. Take your time and take a few normal breaths between deep breaths. 9. The spirometer may include an indicator to show your best effort. Use the indicator as a goal to work toward during each repetition. 10. After each set of 10 deep breaths, practice coughing to be sure your lungs are clear. If you have an incision (the cut made at the time of surgery), support your incision when coughing by placing a pillow or rolled up towels firmly against it. Once you are able to get out of bed, walk around indoors and cough well. You may stop using the incentive spirometer when instructed by your caregiver.  RISKS AND COMPLICATIONS  Take your time so you do not get  dizzy or light-headed.  If you are in pain, you may need to take or ask for pain medication before doing incentive spirometry. It is harder to take a deep breath if you  are having pain. AFTER USE  Rest and breathe slowly and easily.  It can be helpful to keep track of a log of your progress. Your caregiver can provide you with a simple table to help with this. If you are using the spirometer at home, follow these instructions: [place] IF:   You are having difficultly using the spirometer.  You have trouble using the spirometer as often as instructed.  Your pain medication is not giving enough relief while using the spirometer.  You develop fever of 100.5 F (38.1 C) or higher. SEEK IMMEDIATE MEDICAL CARE IF:   You cough up bloody sputum that had not been present before.  You develop fever of 102 F (38.9 C) or greater.  You develop worsening pain at or near the incision site. MAKE SURE YOU:   Understand these instructions.  Will watch your condition.  Will get help right away if you are not doing well or get worse. Document Released: 09/21/2006 Document Revised: 08/03/2011 Document Reviewed: 11/22/2006 ExitCare Patient Information 2014 [employer], [place].   ________________________________________________________________________  WHAT IS A BLOOD TRANSFUSION? Blood Transfusion Information  A transfusion is the replacement of blood or some of its parts. Blood is made up of multiple cells which provide different functions.  Red blood cells carry oxygen and are used for blood loss replacement.  White blood cells fight against infection.  Platelets control bleeding.  Plasma helps clot blood.  Other blood products are available for specialized needs, such as hemophilia or other clotting disorders. BEFORE THE TRANSFUSION  Who gives blood for transfusions?   Healthy volunteers who are fully evaluated to make sure their blood is safe. This is blood bank blood. Transfusion therapy is the safest it has ever been in the practice of medicine. Before blood is taken from a donor, a complete history is taken to make sure that person has  no history of diseases nor engages in risky social behavior (examples are intravenous drug use or sexual activity with multiple partners). The donor's travel history is screened to minimize risk of transmitting infections, such as malaria. The donated blood is tested for signs of infectious diseases, such as HIV and hepatitis. The blood is then tested to be sure it is compatible with you in order to minimize the chance of a transfusion reaction. If you or a relative donates blood, this is often done in anticipation of surgery and is not appropriate for emergency situations. It takes many days to process the donated blood. RISKS AND COMPLICATIONS Although transfusion therapy is very safe and saves many lives, the main dangers of transfusion include:   Getting an infectious disease.  Developing a transfusion reaction. This is an allergic reaction to something in the blood you were given. Every precaution is taken to prevent this. The decision to have a blood transfusion has been considered carefully by your caregiver before blood is given. Blood is not given unless the benefits outweigh the risks. AFTER THE TRANSFUSION  Right after receiving a blood transfusion, you will usually feel much better and more energetic. This is especially true if your red blood cells have gotten low (anemic). The transfusion raises the level of the red blood cells which carry oxygen, and this usually causes an energy increase.  The nurse administering the transfusion will  monitor you carefully for complications. HOME CARE INSTRUCTIONS  No special instructions are needed after a transfusion. You may find your energy is better. Speak with your caregiver about any limitations on activity for underlying diseases you may have. SEEK MEDICAL CARE IF:   Your condition is not improving after your transfusion.  You develop redness or irritation at the intravenous (IV) site. SEEK IMMEDIATE MEDICAL CARE IF:  Any of the following  symptoms occur over the next 12 hours:  Shaking chills.  You have a temperature by mouth above 102 F (38.9 C), not controlled by medicine.  Chest, back, or muscle pain.  People around you feel you are not acting correctly or are confused.  Shortness of breath or difficulty breathing.  Dizziness and fainting.  You get a rash or develop hives.  You have a decrease in urine output.  Your urine turns a dark color or changes to pink, red, or brown. Any of the following symptoms occur over the next 10 days:  You have a temperature by mouth above 102 F (38.9 C), not controlled by medicine.  Shortness of breath.  Weakness after normal activity.  The white part of the eye turns yellow (jaundice).  You have a decrease in the amount of urine or are urinating less often.  Your urine turns a dark color or changes to pink, red, or brown. Document Released: 05/08/2000 Document Revised: 08/03/2011 Document Reviewed: 12/26/2007 [facility] Patient Information 2014 [place], [place].  _______________________________________________________________________

## 2017-02-03 NOTE — Progress Notes (Signed)
Clearance Dr Janene Harvey 01-26-17 on chart   LOV [facility] 01-26-17  LABS 01-26-17 from [facility] [place]  A1C, CBCdiff, CMP, PTINR,   EKG 01-26-17   On chart from [facility] [place]

## 2017-02-04 ENCOUNTER — Encounter (INDEPENDENT_AMBULATORY_CARE_PROVIDER_SITE_OTHER): Payer: Self-pay

## 2017-02-04 ENCOUNTER — Encounter (HOSPITAL_COMMUNITY)
Admission: RE | Admit: 2017-02-04 | Discharge: 2017-02-04 | Disposition: A | Discharge status: Home / Self Care | Payer: BLUE CROSS/BLUE SHIELD | Source: Ambulatory Visit | Attending: Orthopedic Surgery | Admitting: Orthopedic Surgery

## 2017-02-04 ENCOUNTER — Encounter (HOSPITAL_COMMUNITY): Payer: Self-pay

## 2017-02-04 DIAGNOSIS — M1711 Unilateral primary osteoarthritis, right knee: Secondary | ICD-10-CM | POA: Insufficient documentation

## 2017-02-04 DIAGNOSIS — Z01812 Encounter for preprocedural laboratory examination: Secondary | ICD-10-CM | POA: Insufficient documentation

## 2017-02-04 HISTORY — DX: Pneumonia, unspecified organism: J18.9

## 2017-02-04 LAB — SURGICAL PCR SCREEN
MRSA, PCR: NEGATIVE
STAPHYLOCOCCUS AUREUS: NEGATIVE

## 2017-02-04 LAB — ABO/RH: ABO/RH(D): B POS

## 2017-02-11 ENCOUNTER — Inpatient Hospital Stay (HOSPITAL_COMMUNITY): Payer: BLUE CROSS/BLUE SHIELD | Admitting: Registered Nurse

## 2017-02-11 ENCOUNTER — Observation Stay (HOSPITAL_COMMUNITY)
Admission: RE | Admit: 2017-02-11 | Discharge: 2017-02-12 | Disposition: A | Discharge status: Home / Self Care | Payer: BLUE CROSS/BLUE SHIELD | Source: Ambulatory Visit | Attending: Orthopedic Surgery | Admitting: Orthopedic Surgery

## 2017-02-11 ENCOUNTER — Encounter (HOSPITAL_COMMUNITY): Payer: Self-pay | Admitting: *Deleted

## 2017-02-11 ENCOUNTER — Inpatient Hospital Stay (HOSPITAL_COMMUNITY): Payer: BLUE CROSS/BLUE SHIELD

## 2017-02-11 ENCOUNTER — Encounter (HOSPITAL_COMMUNITY)
Admission: RE | Disposition: A | Discharge status: Home / Self Care | Payer: Self-pay | Source: Ambulatory Visit | Attending: Orthopedic Surgery

## 2017-02-11 DIAGNOSIS — I10 Essential (primary) hypertension: Secondary | ICD-10-CM | POA: Insufficient documentation

## 2017-02-11 DIAGNOSIS — Z8249 Family history of ischemic heart disease and other diseases of the circulatory system: Secondary | ICD-10-CM | POA: Insufficient documentation

## 2017-02-11 DIAGNOSIS — Z79899 Other long term (current) drug therapy: Secondary | ICD-10-CM | POA: Insufficient documentation

## 2017-02-11 DIAGNOSIS — K219 Gastro-esophageal reflux disease without esophagitis: Secondary | ICD-10-CM | POA: Insufficient documentation

## 2017-02-11 DIAGNOSIS — Z87891 Personal history of nicotine dependence: Secondary | ICD-10-CM | POA: Diagnosis not present

## 2017-02-11 DIAGNOSIS — Z885 Allergy status to narcotic agent status: Secondary | ICD-10-CM | POA: Insufficient documentation

## 2017-02-11 DIAGNOSIS — Z6839 Body mass index (BMI) 39.0-39.9, adult: Secondary | ICD-10-CM | POA: Diagnosis not present

## 2017-02-11 DIAGNOSIS — Z86010 Personal history of colonic polyps: Secondary | ICD-10-CM | POA: Insufficient documentation

## 2017-02-11 DIAGNOSIS — Z09 Encounter for follow-up examination after completed treatment for conditions other than malignant neoplasm: Secondary | ICD-10-CM

## 2017-02-11 DIAGNOSIS — Z8719 Personal history of other diseases of the digestive system: Secondary | ICD-10-CM | POA: Diagnosis not present

## 2017-02-11 DIAGNOSIS — Z882 Allergy status to sulfonamides status: Secondary | ICD-10-CM | POA: Diagnosis not present

## 2017-02-11 DIAGNOSIS — M1711 Unilateral primary osteoarthritis, right knee: Secondary | ICD-10-CM | POA: Diagnosis present

## 2017-02-11 HISTORY — PX: KNEE ARTHROPLASTY: SHX992

## 2017-02-11 LAB — TYPE AND SCREEN
ABO/RH(D): B POS
Antibody Screen: NEGATIVE

## 2017-02-11 SURGERY — ARTHROPLASTY, KNEE, TOTAL, USING IMAGELESS COMPUTER-ASSISTED NAVIGATION
Anesthesia: Spinal | Site: Knee | Laterality: Right

## 2017-02-11 MED ORDER — CEFAZOLIN SODIUM-DEXTROSE 2-4 GM/100ML-% IV SOLN
2.0000 g | Freq: Four times a day (QID) | INTRAVENOUS | Status: AC
  Administered 2017-02-11 (×2): 2 g via INTRAVENOUS
  Filled 2017-02-11 (×2): qty 100

## 2017-02-11 MED ORDER — VITAMIN C 500 MG PO TABS
2000.0000 mg | ORAL_TABLET | Freq: Every day | ORAL | Status: DC
  Filled 2017-02-11: qty 4

## 2017-02-11 MED ORDER — FENTANYL CITRATE (PF) 250 MCG/5ML IJ SOLN
INTRAMUSCULAR | Status: AC
  Filled 2017-02-11: qty 5

## 2017-02-11 MED ORDER — ALBUTEROL SULFATE (2.5 MG/3ML) 0.083% IN NEBU: 3.0000 mL | INHALATION_SOLUTION | Freq: Four times a day (QID) | RESPIRATORY_TRACT | Status: DC | PRN

## 2017-02-11 MED ORDER — HYDROMORPHONE HCL-NACL 0.5-0.9 MG/ML-% IV SOSY: 0.5000 mg | PREFILLED_SYRINGE | INTRAVENOUS | Status: DC | PRN

## 2017-02-11 MED ORDER — ROCURONIUM BROMIDE 50 MG/5ML IV SOSY
PREFILLED_SYRINGE | INTRAVENOUS | Status: AC
  Filled 2017-02-11: qty 5

## 2017-02-11 MED ORDER — METHOCARBAMOL 1000 MG/10ML IJ SOLN
500.0000 mg | Freq: Four times a day (QID) | INTRAVENOUS | Status: DC | PRN
  Administered 2017-02-11: 500 mg via INTRAVENOUS
  Filled 2017-02-11: qty 550

## 2017-02-11 MED ORDER — SUFENTANIL CITRATE 50 MCG/ML IV SOLN
INTRAVENOUS | Status: DC | PRN
  Administered 2017-02-11: 15 ug via INTRAVENOUS
  Administered 2017-02-11 (×2): 5 ug via INTRAVENOUS
  Administered 2017-02-11 (×3): 10 ug via INTRAVENOUS
  Administered 2017-02-11: 5 ug via INTRAVENOUS
  Administered 2017-02-11: 10 ug via INTRAVENOUS
  Administered 2017-02-11 (×2): 5 ug via INTRAVENOUS

## 2017-02-11 MED ORDER — SENNA 8.6 MG PO TABS
2.0000 | ORAL_TABLET | Freq: Every day | ORAL | Status: DC
  Administered 2017-02-11: 17.2 mg via ORAL
  Filled 2017-02-11 (×2): qty 2

## 2017-02-11 MED ORDER — LABETALOL HCL 5 MG/ML IV SOLN
INTRAVENOUS | Status: DC | PRN
  Administered 2017-02-11 (×2): 5 mg via INTRAVENOUS

## 2017-02-11 MED ORDER — LACTATED RINGERS IV SOLN
INTRAVENOUS | Status: DC | PRN
  Administered 2017-02-11 (×3): via INTRAVENOUS

## 2017-02-11 MED ORDER — ISOPROPYL ALCOHOL 70 % SOLN
Status: DC | PRN
  Administered 2017-02-11: 1 via TOPICAL

## 2017-02-11 MED ORDER — HYDROMORPHONE HCL-NACL 0.5-0.9 MG/ML-% IV SOSY
0.2500 mg | PREFILLED_SYRINGE | INTRAVENOUS | Status: DC | PRN
  Administered 2017-02-11 (×3): 0.5 mg via INTRAVENOUS

## 2017-02-11 MED ORDER — ONDANSETRON HCL 4 MG/2ML IJ SOLN
INTRAMUSCULAR | Status: DC | PRN
  Administered 2017-02-11: 4 mg via INTRAVENOUS

## 2017-02-11 MED ORDER — ONDANSETRON HCL 4 MG/2ML IJ SOLN: 4.0000 mg | Freq: Four times a day (QID) | INTRAMUSCULAR | Status: DC | PRN

## 2017-02-11 MED ORDER — HYDROMORPHONE HCL 1 MG/ML IJ SOLN
INTRAMUSCULAR | Status: DC | PRN
  Administered 2017-02-11 (×2): 1 mg via INTRAVENOUS

## 2017-02-11 MED ORDER — SUGAMMADEX SODIUM 200 MG/2ML IV SOLN
INTRAVENOUS | Status: DC | PRN
  Administered 2017-02-11: 200 mg via INTRAVENOUS

## 2017-02-11 MED ORDER — HYDROMORPHONE HCL 2 MG/ML IJ SOLN
INTRAMUSCULAR | Status: AC
Start: 2017-02-11 — End: ?
  Filled 2017-02-11: qty 1

## 2017-02-11 MED ORDER — MIDAZOLAM HCL 2 MG/2ML IJ SOLN
INTRAMUSCULAR | Status: AC
  Filled 2017-02-11: qty 2

## 2017-02-11 MED ORDER — FENTANYL CITRATE (PF) 100 MCG/2ML IJ SOLN
INTRAMUSCULAR | Status: AC
  Administered 2017-02-11: 50 ug via INTRAVENOUS
  Filled 2017-02-11: qty 4

## 2017-02-11 MED ORDER — SODIUM CHLORIDE 0.9 % IV SOLN
INTRAVENOUS | Status: DC
  Administered 2017-02-11 – 2017-02-12 (×3): via INTRAVENOUS

## 2017-02-11 MED ORDER — SODIUM CHLORIDE 0.9 % IJ SOLN
INTRAMUSCULAR | Status: DC | PRN
  Administered 2017-02-11: 50 mL

## 2017-02-11 MED ORDER — BUPIVACAINE-EPINEPHRINE 0.25% -1:200000 IJ SOLN
INTRAMUSCULAR | Status: DC | PRN
  Administered 2017-02-11: 30 mL

## 2017-02-11 MED ORDER — SUCCINYLCHOLINE CHLORIDE 200 MG/10ML IV SOSY
PREFILLED_SYRINGE | INTRAVENOUS | Status: AC
  Filled 2017-02-11: qty 10

## 2017-02-11 MED ORDER — SODIUM CHLORIDE 0.9 % IV SOLN: INTRAVENOUS | Status: DC

## 2017-02-11 MED ORDER — MENTHOL 3 MG MT LOZG: 1.0000 | LOZENGE | OROMUCOSAL | Status: DC | PRN

## 2017-02-11 MED ORDER — ACETAMINOPHEN 10 MG/ML IV SOLN
1000.0000 mg | INTRAVENOUS | Status: AC
  Administered 2017-02-11: 1000 mg via INTRAVENOUS

## 2017-02-11 MED ORDER — SUGAMMADEX SODIUM 200 MG/2ML IV SOLN
INTRAVENOUS | Status: AC
  Filled 2017-02-11: qty 2

## 2017-02-11 MED ORDER — HYDROMORPHONE HCL-NACL 0.5-0.9 MG/ML-% IV SOSY
PREFILLED_SYRINGE | INTRAVENOUS | Status: AC
  Administered 2017-02-11: 0.5 mg via INTRAVENOUS
  Filled 2017-02-11: qty 3

## 2017-02-11 MED ORDER — DIPHENHYDRAMINE HCL 12.5 MG/5ML PO ELIX: 12.5000 mg | ORAL_SOLUTION | ORAL | Status: DC | PRN

## 2017-02-11 MED ORDER — BUPIVACAINE-EPINEPHRINE (PF) 0.25% -1:200000 IJ SOLN
INTRAMUSCULAR | Status: AC
Start: 2017-02-11 — End: ?
  Filled 2017-02-11: qty 30

## 2017-02-11 MED ORDER — CEFAZOLIN SODIUM-DEXTROSE 2-4 GM/100ML-% IV SOLN
2.0000 g | INTRAVENOUS | Status: AC
  Administered 2017-02-11: 2 g via INTRAVENOUS

## 2017-02-11 MED ORDER — HYDROCODONE-ACETAMINOPHEN 5-325 MG PO TABS
1.0000 | ORAL_TABLET | ORAL | Status: DC | PRN
  Administered 2017-02-11: 1 via ORAL
  Administered 2017-02-12 (×2): 2 via ORAL
  Filled 2017-02-11: qty 2
  Filled 2017-02-11 (×3): qty 1

## 2017-02-11 MED ORDER — PHENOL 1.4 % MT LIQD: 1.0000 | OROMUCOSAL | Status: DC | PRN

## 2017-02-11 MED ORDER — VITAMIN D3 25 MCG (1000 UNIT) PO TABS
5000.0000 [IU] | ORAL_TABLET | Freq: Every day | ORAL | Status: DC
  Filled 2017-02-11: qty 5

## 2017-02-11 MED ORDER — ACETAMINOPHEN 325 MG PO TABS: 650.0000 mg | ORAL_TABLET | Freq: Four times a day (QID) | ORAL | Status: DC | PRN

## 2017-02-11 MED ORDER — METHOCARBAMOL 500 MG PO TABS
500.0000 mg | ORAL_TABLET | Freq: Four times a day (QID) | ORAL | Status: DC | PRN
  Administered 2017-02-11 – 2017-02-12 (×2): 500 mg via ORAL
  Filled 2017-02-11 (×2): qty 1

## 2017-02-11 MED ORDER — OXYCODONE HCL ER 10 MG PO T12A
10.0000 mg | EXTENDED_RELEASE_TABLET | Freq: Two times a day (BID) | ORAL | Status: DC
  Administered 2017-02-11 – 2017-02-12 (×3): 10 mg via ORAL
  Filled 2017-02-11 (×3): qty 1

## 2017-02-11 MED ORDER — ASPIRIN 81 MG PO CHEW: 81.0000 mg | CHEWABLE_TABLET | Freq: Two times a day (BID) | ORAL | Status: DC

## 2017-02-11 MED ORDER — DOCUSATE SODIUM 100 MG PO CAPS
100.0000 mg | ORAL_CAPSULE | Freq: Two times a day (BID) | ORAL | Status: DC
  Administered 2017-02-11 – 2017-02-12 (×3): 100 mg via ORAL
  Filled 2017-02-11 (×4): qty 1

## 2017-02-11 MED ORDER — METOCLOPRAMIDE HCL 5 MG/ML IJ SOLN: 5.0000 mg | Freq: Three times a day (TID) | INTRAMUSCULAR | Status: DC | PRN

## 2017-02-11 MED ORDER — SODIUM CHLORIDE 0.9 % IJ SOLN
INTRAMUSCULAR | Status: AC
  Filled 2017-02-11: qty 50

## 2017-02-11 MED ORDER — KETOROLAC TROMETHAMINE 15 MG/ML IJ SOLN
15.0000 mg | Freq: Four times a day (QID) | INTRAMUSCULAR | Status: AC
  Administered 2017-02-11 – 2017-02-12 (×4): 15 mg via INTRAVENOUS
  Filled 2017-02-11 (×4): qty 1

## 2017-02-11 MED ORDER — SUCCINYLCHOLINE CHLORIDE 20 MG/ML IJ SOLN
INTRAMUSCULAR | Status: DC | PRN
  Administered 2017-02-11: 160 mg via INTRAVENOUS

## 2017-02-11 MED ORDER — CEFAZOLIN SODIUM-DEXTROSE 2-4 GM/100ML-% IV SOLN
INTRAVENOUS | Status: AC
  Filled 2017-02-11: qty 100

## 2017-02-11 MED ORDER — ROCURONIUM BROMIDE 100 MG/10ML IV SOLN
INTRAVENOUS | Status: DC | PRN
  Administered 2017-02-11: 45 mg via INTRAVENOUS
  Administered 2017-02-11: 5 mg via INTRAVENOUS

## 2017-02-11 MED ORDER — FAMOTIDINE 20 MG PO TABS: 20.0000 mg | ORAL_TABLET | Freq: Every day | ORAL | Status: DC | PRN

## 2017-02-11 MED ORDER — ONDANSETRON HCL 4 MG/2ML IJ SOLN
INTRAMUSCULAR | Status: AC
  Filled 2017-02-11: qty 2

## 2017-02-11 MED ORDER — SODIUM CHLORIDE 0.9 % IV SOLN
1000.0000 mg | Freq: Once | INTRAVENOUS | Status: AC
  Administered 2017-02-11: 1000 mg via INTRAVENOUS
  Filled 2017-02-11: qty 1100

## 2017-02-11 MED ORDER — PANTOPRAZOLE SODIUM 40 MG PO TBEC
80.0000 mg | DELAYED_RELEASE_TABLET | Freq: Every day | ORAL | Status: DC
  Administered 2017-02-11 – 2017-02-12 (×2): 80 mg via ORAL
  Filled 2017-02-11 (×2): qty 2

## 2017-02-11 MED ORDER — GLYCOPYRROLATE 0.2 MG/ML IJ SOLN
INTRAMUSCULAR | Status: DC | PRN
  Administered 2017-02-11: 0.2 mg via INTRAVENOUS

## 2017-02-11 MED ORDER — SODIUM CHLORIDE 0.9 % IR SOLN
Status: DC | PRN
  Administered 2017-02-11: 3000 mL
  Administered 2017-02-11: 1000 mL

## 2017-02-11 MED ORDER — SODIUM CHLORIDE 0.9 % IJ SOLN
INTRAMUSCULAR | Status: AC
  Filled 2017-02-11: qty 20

## 2017-02-11 MED ORDER — TRANEXAMIC ACID 1000 MG/10ML IV SOLN
1000.0000 mg | INTRAVENOUS | Status: AC
  Administered 2017-02-11: 1000 mg via INTRAVENOUS
  Filled 2017-02-11: qty 1100

## 2017-02-11 MED ORDER — ONDANSETRON HCL 4 MG PO TABS: 4.0000 mg | ORAL_TABLET | Freq: Four times a day (QID) | ORAL | Status: DC | PRN

## 2017-02-11 MED ORDER — FENTANYL CITRATE (PF) 100 MCG/2ML IJ SOLN
INTRAMUSCULAR | Status: AC
  Filled 2017-02-11: qty 2

## 2017-02-11 MED ORDER — ACETAMINOPHEN 650 MG RE SUPP: 650.0000 mg | Freq: Four times a day (QID) | RECTAL | Status: DC | PRN

## 2017-02-11 MED ORDER — LORATADINE 10 MG PO TABS: 10.0000 mg | ORAL_TABLET | Freq: Every evening | ORAL | Status: DC | PRN

## 2017-02-11 MED ORDER — GLYCOPYRROLATE 0.2 MG/ML IV SOSY
PREFILLED_SYRINGE | INTRAVENOUS | Status: AC
  Filled 2017-02-11: qty 5

## 2017-02-11 MED ORDER — SUFENTANIL CITRATE 50 MCG/ML IV SOLN
INTRAVENOUS | Status: AC
  Filled 2017-02-11: qty 1

## 2017-02-11 MED ORDER — DEXAMETHASONE SODIUM PHOSPHATE 10 MG/ML IJ SOLN
10.0000 mg | Freq: Once | INTRAMUSCULAR | Status: AC
  Administered 2017-02-12: 10 mg via INTRAVENOUS
  Filled 2017-02-11: qty 1

## 2017-02-11 MED ORDER — KETOROLAC TROMETHAMINE 30 MG/ML IJ SOLN
INTRAMUSCULAR | Status: DC | PRN
  Administered 2017-02-11: 30 mg

## 2017-02-11 MED ORDER — POVIDONE-IODINE 10 % EX SWAB: 2.0000 "application " | Freq: Once | CUTANEOUS | Status: DC

## 2017-02-11 MED ORDER — FENTANYL CITRATE (PF) 100 MCG/2ML IJ SOLN
25.0000 ug | INTRAMUSCULAR | Status: DC | PRN
  Administered 2017-02-11 (×2): 50 ug via INTRAVENOUS

## 2017-02-11 MED ORDER — ISOPROPYL ALCOHOL 70 % SOLN
Status: AC
  Filled 2017-02-11: qty 480

## 2017-02-11 MED ORDER — ASPIRIN 81 MG PO CHEW
81.0000 mg | CHEWABLE_TABLET | Freq: Two times a day (BID) | ORAL | Status: DC
  Administered 2017-02-11 – 2017-02-12 (×2): 81 mg via ORAL
  Filled 2017-02-11 (×3): qty 1

## 2017-02-11 MED ORDER — POLYETHYLENE GLYCOL 3350 17 G PO PACK: 17.0000 g | PACK | Freq: Every day | ORAL | Status: DC | PRN

## 2017-02-11 MED ORDER — DARIFENACIN HYDROBROMIDE ER 15 MG PO TB24
15.0000 mg | ORAL_TABLET | Freq: Every day | ORAL | Status: DC
  Administered 2017-02-12: 08:00:00 15 mg via ORAL
  Filled 2017-02-11 (×2): qty 1

## 2017-02-11 MED ORDER — CHLORHEXIDINE GLUCONATE 4 % EX LIQD: 60.0000 mL | Freq: Once | CUTANEOUS | Status: DC

## 2017-02-11 MED ORDER — HYDROGEN PEROXIDE 3 % EX SOLN
CUTANEOUS | Status: AC
  Filled 2017-02-11: qty 473

## 2017-02-11 MED ORDER — ACETAMINOPHEN 10 MG/ML IV SOLN
INTRAVENOUS | Status: AC
  Filled 2017-02-11: qty 100

## 2017-02-11 MED ORDER — ALUM & MAG HYDROXIDE-SIMETH 200-200-20 MG/5ML PO SUSP: 30.0000 mL | ORAL | Status: DC | PRN

## 2017-02-11 MED ORDER — ARTIFICIAL TEARS OP OINT
TOPICAL_OINTMENT | OPHTHALMIC | Status: AC
  Filled 2017-02-11: qty 3.5

## 2017-02-11 MED ORDER — LIDOCAINE 2% (20 MG/ML) 5 ML SYRINGE
INTRAMUSCULAR | Status: AC
Start: 2017-02-11 — End: ?
  Filled 2017-02-11: qty 10

## 2017-02-11 MED ORDER — DEXAMETHASONE SODIUM PHOSPHATE 10 MG/ML IJ SOLN
INTRAMUSCULAR | Status: DC | PRN
  Administered 2017-02-11: 10 mg via INTRAVENOUS

## 2017-02-11 MED ORDER — KETOROLAC TROMETHAMINE 30 MG/ML IJ SOLN
INTRAMUSCULAR | Status: AC
  Filled 2017-02-11: qty 1

## 2017-02-11 MED ORDER — DEXAMETHASONE SODIUM PHOSPHATE 10 MG/ML IJ SOLN
INTRAMUSCULAR | Status: AC
  Filled 2017-02-11: qty 1

## 2017-02-11 MED ORDER — METOCLOPRAMIDE HCL 5 MG PO TABS: 5.0000 mg | ORAL_TABLET | Freq: Three times a day (TID) | ORAL | Status: DC | PRN

## 2017-02-11 MED ORDER — PROPOFOL 10 MG/ML IV BOLUS
INTRAVENOUS | Status: DC | PRN
  Administered 2017-02-11: 300 mg via INTRAVENOUS

## 2017-02-11 MED ORDER — FENTANYL CITRATE (PF) 100 MCG/2ML IJ SOLN
INTRAMUSCULAR | Status: DC | PRN
  Administered 2017-02-11: 100 ug via INTRAVENOUS
  Administered 2017-02-11: 150 ug via INTRAVENOUS
  Administered 2017-02-11: 100 ug via INTRAVENOUS

## 2017-02-11 MED ORDER — EPHEDRINE 5 MG/ML INJ
INTRAVENOUS | Status: AC
  Filled 2017-02-11: qty 10

## 2017-02-11 MED ORDER — MIDAZOLAM HCL 5 MG/5ML IJ SOLN
INTRAMUSCULAR | Status: DC | PRN
  Administered 2017-02-11: 2 mg via INTRAVENOUS

## 2017-02-11 MED ORDER — PROPOFOL 10 MG/ML IV BOLUS
INTRAVENOUS | Status: AC
  Filled 2017-02-11: qty 60

## 2017-02-11 MED ORDER — VITAMIN B-12 1000 MCG PO TABS
5000.0000 ug | ORAL_TABLET | Freq: Every day | ORAL | Status: DC
  Filled 2017-02-11: qty 5

## 2017-02-11 SURGICAL SUPPLY — 54 items
BAG ZIPLOCK 12X15 (MISCELLANEOUS) IMPLANT
BANDAGE ACE 4X5 VEL STRL LF (GAUZE/BANDAGES/DRESSINGS) ×3 IMPLANT
BANDAGE ACE 6X5 VEL STRL LF (GAUZE/BANDAGES/DRESSINGS) ×3 IMPLANT
BLADE SAW RECIPROCATING 77.5 (BLADE) ×3 IMPLANT
CAPT KNEE TRIATH TK-4 ×3 IMPLANT
CHLORAPREP W/TINT 26ML (MISCELLANEOUS) ×6 IMPLANT
COVER SURGICAL LIGHT HANDLE (MISCELLANEOUS) ×3 IMPLANT
CUFF TOURN SGL QUICK 34 (TOURNIQUET CUFF) ×2
CUFF TRNQT CYL 34X4X40X1 (TOURNIQUET CUFF) ×1 IMPLANT
DECANTER SPIKE VIAL GLASS SM (MISCELLANEOUS) ×6 IMPLANT
DERMABOND ADVANCED (GAUZE/BANDAGES/DRESSINGS) ×4
DERMABOND ADVANCED .7 DNX12 (GAUZE/BANDAGES/DRESSINGS) ×2 IMPLANT
DRAPE SHEET LG 3/4 BI-LAMINATE (DRAPES) ×6 IMPLANT
DRAPE U-SHAPE 47X51 STRL (DRAPES) ×3 IMPLANT
DRSG AQUACEL AG ADV 3.5X10 (GAUZE/BANDAGES/DRESSINGS) ×3 IMPLANT
DRSG TEGADERM 4X4.75 (GAUZE/BANDAGES/DRESSINGS) IMPLANT
ELECT BLADE TIP CTD 4 INCH (ELECTRODE) ×3 IMPLANT
ELECT REM PT RETURN 15FT ADLT (MISCELLANEOUS) ×3 IMPLANT
EVACUATOR 1/8 PVC DRAIN (DRAIN) IMPLANT
GAUZE SPONGE 4X4 12PLY STRL (GAUZE/BANDAGES/DRESSINGS) ×3 IMPLANT
GLOVE BIO SURGEON STRL SZ8.5 (GLOVE) ×6 IMPLANT
GLOVE BIOGEL PI IND STRL 8.5 (GLOVE) ×1 IMPLANT
GLOVE BIOGEL PI INDICATOR 8.5 (GLOVE) ×2
GOWN SPEC L3 XXLG W/TWL (GOWN DISPOSABLE) ×3 IMPLANT
HANDPIECE INTERPULSE COAX TIP (DISPOSABLE) ×2
HOOD PEEL AWAY FLYTE STAYCOOL (MISCELLANEOUS) ×9 IMPLANT
MARKER SKIN DUAL TIP RULER LAB (MISCELLANEOUS) ×3 IMPLANT
NEEDLE SPNL 18GX3.5 QUINCKE PK (NEEDLE) ×3 IMPLANT
NS IRRIG 1000ML POUR BTL (IV SOLUTION) ×3 IMPLANT
PACK TOTAL KNEE CUSTOM (KITS) ×3 IMPLANT
PADDING CAST COTTON 6X4 STRL (CAST SUPPLIES) ×3 IMPLANT
POSITIONER SURGICAL ARM (MISCELLANEOUS) ×3 IMPLANT
SAW OSC TIP CART 19.5X105X1.3 (SAW) ×3 IMPLANT
SEALER BIPOLAR AQUA 6.0 (INSTRUMENTS) ×3 IMPLANT
SET HNDPC FAN SPRY TIP SCT (DISPOSABLE) ×1 IMPLANT
SET PAD KNEE POSITIONER (MISCELLANEOUS) ×3 IMPLANT
SPONGE DRAIN TRACH 4X4 STRL 2S (GAUZE/BANDAGES/DRESSINGS) IMPLANT
SPONGE LAP 18X18 X RAY DECT (DISPOSABLE) IMPLANT
SUCTION FRAZIER HANDLE 12FR (TUBING) ×2
SUCTION TUBE FRAZIER 12FR DISP (TUBING) ×1 IMPLANT
SUT MNCRL AB 3-0 PS2 18 (SUTURE) ×3 IMPLANT
SUT MON AB 2-0 CT1 36 (SUTURE) ×6 IMPLANT
SUT STRATAFIX PDO 1 14 VIOLET (SUTURE) ×2
SUT STRATFX PDO 1 14 VIOLET (SUTURE) ×1
SUT VIC AB 1 CT1 36 (SUTURE) ×9 IMPLANT
SUT VIC AB 2-0 CT1 27 (SUTURE) ×2
SUT VIC AB 2-0 CT1 TAPERPNT 27 (SUTURE) ×1 IMPLANT
SUTURE STRATFX PDO 1 14 VIOLET (SUTURE) ×1 IMPLANT
SYR 50ML LL SCALE MARK (SYRINGE) ×3 IMPLANT
TOWER CARTRIDGE SMART MIX (DISPOSABLE) IMPLANT
TRAY FOLEY W/METER SILVER 16FR (SET/KITS/TRAYS/PACK) ×3 IMPLANT
WATER STERILE IRR 1000ML POUR (IV SOLUTION) ×6 IMPLANT
WRAP KNEE MAXI GEL POST OP (GAUZE/BANDAGES/DRESSINGS) ×3 IMPLANT
YANKAUER SUCT BULB TIP 10FT TU (MISCELLANEOUS) ×3 IMPLANT

## 2017-02-11 NOTE — Op Note (Signed)
OPERATIVE REPORT  SURGEON: Rod Can, MD   ASSISTANT: Nehemiah Massed, PA-C  PREOPERATIVE DIAGNOSIS: Right knee arthritis.   POSTOPERATIVE DIAGNOSIS: Right knee arthritis.   PROCEDURE: Right total knee arthroplasty.   IMPLANTS: Stryker Triathlon CR femur, size 5. Stryker Tritanium tibia, size 5. X3 polyethelyene insert, size 9 mm, CR. 3 button asymmetric patella, size 35 mm.  ANESTHESIA:  General and Regional  TOURNIQUET TIME: Not utilized.   ESTIMATED BLOOD LOSS: 250 mL.  ANTIBIOTICS: 2 g Ancef.  DRAINS: None.  COMPLICATIONS: None   CONDITION: PACU - hemodynamically stable.   BRIEF CLINICAL NOTE: Judith Nelson is a 61 y.o. female with a long-standing history of Right knee arthritis. After failing conservative management, the patient was indicated for total knee arthroplasty. The risks, benefits, and alternatives to the procedure were explained, and the patient elected to proceed.  PROCEDURE IN DETAIL: Adductor canal block was obtained in the pre-op holding area. Once inside the operative room, spinal anesthesia was attempted unsuccessfully. General anesthesia was induced, and a foley catheter was inserted. The patient was then positioned, a nonsterile tourniquet was placed, and the lower extremity was prepped and draped in the normal sterile surgical fashion. A time-out was called verifying side and site of surgery. The patient received IV antibiotics within 60 minutes of beginning the procedure. The tourniquet was not utilized.  An anterior approach to the knee was performed utilizing a midvastus arthrotomy. A medial release was performed and the patellar fat pad was excised. Stryker navigation was used to cut the distal femur perpendicular to the mechanical axis. A freehand patellar resection was performed, and the patella was sized an prepared with 3 lug  holes.  Nagivation was used to make a neutral proximal tibia resection, taking 9 mm of bone from the less affected lateral side with 3 degrees of slope. The menisci were excised. A spacer block was placed, and the alignment and balance in extension were confirmed.   The distal femur was sized using the 3-degree external rotation guide referencing the posterior femoral cortex. The appropriate 4-in-1 cutting block was pinned into place. Rotation was checked using Whiteside's line, the epicondylar axis, and then confirmed with a spacer block in flexion. The remaining femoral cuts were performed, taking care to protect the MCL.  The tibia was sized and the trial tray was pinned into place. The remaining trail components were inserted. The knee was stable to varus and valgus stress through a full range of motion. The patella tracked centrally, and the PCL was well balanced. The trial components were removed, and the proximal tibial surface was prepared. Final components were impacted into place. The knee was tested for a final time and found to be well balanced.  The wound was copiously irrigated with a dilute betadine solution followed by normal saline with pulse lavage. Marcaine solution was injected into the periarticular soft tissue. The wound was closed in layers using #1 Vicryl and Stratafix for the fascia, 2-0 Vicryl for the subcutaneous fat, 2-0 Monocryl for the deep dermal layer, 3-0 running Monocryl subcuticular Stitch, and Dermabond for the skin. Once the glue was fully dried, an Aquacell Ag and compressive dressing were applied. Tthe patient was transported to the recovery room in stable condition. Sponge, needle, and instrument counts were correct at the end of the case x2. The patient tolerated the procedure well and there were no known complications.  Please note that a surgical assistant was a medical necessity for this procedure in order to perform it  in a safe and expeditious manner.  Surgical assistant was necessary to retract the ligaments and vital neurovascular structures to prevent injury to them and also necessary for proper positioning of the limb to allow for anatomic placement of the prosthesis.

## 2017-02-11 NOTE — H&P (View-Only) (Signed)
TOTAL KNEE ADMISSION H&P  Patient is being admitted for right total knee arthroplasty.  Subjective:  Chief Complaint:right knee pain.  HPI: Judith Nelson, 61 y.o. female, has a history of pain and functional disability in the right knee due to arthritis and has failed non-surgical conservative treatments for greater than 12 weeks to includeNSAID's and/or analgesics, corticosteriod injections, viscosupplementation injections, flexibility and strengthening excercises, use of assistive devices, weight reduction as appropriate and activity modification.  Onset of symptoms was gradual, starting 5 years ago with gradually worsening course since that time. The patient noted no past surgery on the right knee(s).  Patient currently rates pain in the right knee(s) at 10 out of 10 with activity. Patient has night pain, worsening of pain with activity and weight bearing, pain that interferes with activities of daily living, pain with passive range of motion, crepitus and joint swelling.  Patient has evidence of subchondral cysts, subchondral sclerosis, periarticular osteophytes and joint space narrowing by imaging studies. There is no active infection.  Patient Active Problem List   Diagnosis Date Noted  . Diverticulitis of colon (without mention of hemorrhage)(562.11)   . Polyp of sigmoid colon   . Generalized abdominal pain   . Diverticulitis of large intestine with perforation without bleeding 06/18/2016  . Allergic rhinitis, seasonal 11/14/2015  . Morbid obesity with BMI of 40.0-44.9, adult ([place]) 05/29/2015  . Preventative health care 05/29/2015  . GERD (gastroesophageal reflux disease) 05/29/2015  . URI (upper respiratory infection) 05/29/2015  . Urge incontinence 05/29/2015  . 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 HTN (hypertension) 03/23/2014  . Hyperlipidemia 03/23/2014   Past Medical History:  Diagnosis Date  . Allergic rhinitis, seasonal 11/14/2015  . Chronic bronchitis ([place])   . Chronic  hypokalemia 07/19/2014  . Diverticulitis of large intestine with perforation without bleeding 06/18/2016  . Generalized abdominal pain   . GERD (gastroesophageal reflux disease)   . HTN (hypertension) 03/23/2014  . Hypertension   . Morbid obesity with BMI of 40.0-44.9, adult ([place]) 05/29/2015  . Preventative health care 05/29/2015  . Reflux   . Urge incontinence 05/29/2015  . URI (upper respiratory infection) 05/29/2015    Past Surgical History:  Procedure Laterality Date  . BUNIONECTOMY    . COLONOSCOPY WITH PROPOFOL N/A 09/18/2016   Procedure: COLONOSCOPY WITH PROPOFOL;  Surgeon: Lucilla Lame, MD;  Location: [facility];  Service: Endoscopy;  Laterality: N/A;  . PLANTAR FASCIA RELEASE Right 2010  . POLYPECTOMY N/A 09/18/2016   Procedure: POLYPECTOMY;  Surgeon: Lucilla Lame, MD;  Location: [facility];  Service: Gastroenterology;  Laterality: N/A;  . TUBAL LIGATION    . TUBAL LIGATION  1991     (Not in a hospital admission) Allergies  Allergen Reactions  . Hydrocodone-Chlorpheniramine Other (See Comments)    Insomnia   . Sulfa Antibiotics Other (See Comments)    Fever- had reaction as a child    Social History  Substance Use Topics  . Smoking status: Former Smoker    Packs/day: 2.00    Years: 15.00    Types: Cigarettes    Quit date: 05/25/1988  . Smokeless tobacco: Never Used  . Alcohol use 1.8 oz/week    2 Glasses of wine, 1 Cans of beer per week    Family History  Problem Relation Age of Onset  . Hypertension Mother   . Hyperlipidemia Mother   . Heart disease Mother        CABG & valve replacement   . Diabetes Mother   .  Hypertension Father   . Hyperlipidemia Sister   . Hypertension Sister   . Hypertension Sister   . Hyperlipidemia Sister   . Hypertension Sister   . Hyperlipidemia Sister      Review of Systems  Constitutional: Negative.   HENT: Negative.   Eyes: Negative.   Respiratory: Negative.   Cardiovascular: Negative.   Gastrointestinal:  Negative.   Genitourinary: Negative.   Musculoskeletal: Positive for joint pain.  Skin: Negative.   Neurological: Negative.   Endo/Heme/Allergies: Negative.   Psychiatric/Behavioral: Negative.     Objective:  Physical Exam  Vitals reviewed. Constitutional: She is oriented to person, place, and time. She appears well-developed and well-nourished.  HENT:  Head: Normocephalic and atraumatic.  Eyes: Pupils are equal, round, and reactive to light. Conjunctivae and EOM are normal.  Neck: Normal range of motion. Neck supple.  Cardiovascular: Normal rate, normal heart sounds and intact distal pulses.   Respiratory: Effort normal. No respiratory distress.  GI: Soft. She exhibits no distension.  Genitourinary:  Genitourinary Comments: deferred  Musculoskeletal:       Right knee: She exhibits decreased range of motion, swelling, effusion and abnormal alignment. Tenderness found. Medial joint line and lateral joint line tenderness noted.  Neurological: She is alert and oriented to person, place, and time. She has normal reflexes.  Skin: Skin is warm and dry.  Psychiatric: She has a normal mood and affect. Her behavior is normal. Judgment and thought content normal.    Vital signs in last 24 hours: @VSRANGES @  Labs:   Estimated body mass index is 39.94 kg/m as calculated from the following:   Height as of 11/30/16: 5\' 7"  (1.702 m).   Weight as of 11/30/16: 115.7 kg (255 lb).   Imaging Review Plain radiographs demonstrate severe degenerative joint disease of the right knee(s). The overall alignment issignificant varus. The bone quality appears to be adequate for age and reported activity level.  Assessment/Plan:  End stage arthritis, right knee   The patient history, physical examination, clinical judgment of the provider and imaging studies are consistent with end stage degenerative joint disease of the right knee(s) and total knee arthroplasty is deemed medically necessary. The  treatment options including medical management, injection therapy arthroscopy and arthroplasty were discussed at length. The risks and benefits of total knee arthroplasty were presented and reviewed. The risks due to aseptic loosening, infection, stiffness, patella tracking problems, thromboembolic complications and other imponderables were discussed. The patient acknowledged the explanation, agreed to proceed with the plan and consent was signed. Patient is being admitted for inpatient treatment for surgery, pain control, PT, OT, prophylactic antibiotics, VTE prophylaxis, progressive ambulation and ADL's and discharge planning. The patient is planning to be discharged home with outpatient PT

## 2017-02-11 NOTE — Interval H&P Note (Signed)
History and Physical Interval Note:  02/11/2017 7:32 AM  Judith Nelson  has presented today for surgery, with the diagnosis of Right knee degenerative joint disease  The various methods of treatment have been discussed with the patient and family. After consideration of risks, benefits and other options for treatment, the patient has consented to  Procedure(s) with comments: RIGHT TOTAL KNEE ARTHROPLASTY WITH COMPUTER NAVIGATION (Right) - Needs RNFA as a surgical intervention .  The patient's history has been reviewed, patient examined, no change in status, stable for surgery.  I have reviewed the patient's chart and labs.  Questions were answered to the patient's satisfaction.     Dionisio Aragones, Horald Pollen

## 2017-02-11 NOTE — Transfer of Care (Signed)
Immediate Anesthesia Transfer of Care Note  Patient: Judith Nelson  Procedure(s) Performed: Procedure(s) with comments: RIGHT TOTAL KNEE ARTHROPLASTY WITH COMPUTER NAVIGATION (Right) - Needs RNFA  Patient Location: PACU  Anesthesia Type:General  Level of Consciousness: awake, alert , oriented and patient cooperative  Airway & Oxygen Therapy: Patient Spontanous Breathing and Patient connected to face mask oxygen  Post-op Assessment: Report given to RN, Post -op Vital signs reviewed and stable and Patient moving all extremities X 4  Post vital signs: stable  Last Vitals:  Vitals:   02/11/17 0522 02/11/17 1030  BP: (!) 145/69   Pulse: 77   Resp: 16   Temp: 36.9 C 36.6 C  SpO2: 98%     Last Pain:  Vitals:   02/11/17 0522  TempSrc: Oral      Patients Stated Pain Goal: 3 (59/97/74 1423)  Complications: No apparent anesthesia complications

## 2017-02-11 NOTE — Anesthesia Preprocedure Evaluation (Addendum)
Anesthesia Evaluation  Patient identified by MRN, date of birth, ID band Patient awake    Reviewed: Allergy & Precautions, NPO status , Patient's Chart, lab work & pertinent test results  Airway Mallampati: II  TM Distance: >3 FB     Dental   Pulmonary pneumonia, former smoker,    breath sounds clear to auscultation       Cardiovascular hypertension,  Rhythm:Regular Rate:Normal     Neuro/Psych    GI/Hepatic Neg liver ROS, GERD  ,  Endo/Other  negative endocrine ROS  Renal/GU negative Renal ROS     Musculoskeletal   Abdominal   Peds  Hematology   Anesthesia Other Findings   Reproductive/Obstetrics                             Anesthesia Physical Anesthesia Plan  ASA: III  Anesthesia Plan: Spinal   Post-op Pain Management:  Regional for Post-op pain   Induction: Intravenous  PONV Risk Score and Plan: 2 and Ondansetron, Dexamethasone and Treatment may vary due to age or medical condition  Airway Management Planned: Simple Face Mask  Additional Equipment:   Intra-op Plan:   Post-operative Plan:   Informed Consent: I have reviewed the patients History and Physical, chart, labs and discussed the procedure including the risks, benefits and alternatives for the proposed anesthesia with the patient or authorized representative who has indicated his/her understanding and acceptance.   Dental advisory given  Plan Discussed with: CRNA and Anesthesiologist  Anesthesia Plan Comments:        Anesthesia Quick Evaluation

## 2017-02-11 NOTE — Discharge Instructions (Signed)
° °Dr. Antonina Deziel °Total Joint Specialist °[facility] °[address] °[place], [place] [address] °[phone] ° °TOTAL KNEE REPLACEMENT POSTOPERATIVE DIRECTIONS ° ° ° °Knee Rehabilitation, Guidelines Following Surgery  °Results after knee surgery are often greatly improved when you follow the exercise, range of motion and muscle strengthening exercises prescribed by your doctor. Safety measures are also important to protect the knee from further injury. Any time any of these exercises cause you to have increased pain or swelling in your knee joint, decrease the amount until you are comfortable again and slowly increase them. If you have problems or questions, call your caregiver or physical therapist for advice.  ° °WEIGHT BEARING °Weight bearing as tolerated with assist device (walker, cane, etc) as directed, use it as long as suggested by your surgeon or therapist, typically at least 4-6 weeks. ° °HOME CARE INSTRUCTIONS  °Remove items at home which could result in a fall. This includes throw rugs or furniture in walking pathways.  °Continue medications as instructed at time of discharge. °You may have some home medications which will be placed on hold until you complete the course of blood thinner medication.  °You may start showering once you are discharged home but do not submerge the incision under water. Just pat the incision dry and apply a dry gauze dressing on daily. °Walk with walker as instructed.  °You may resume a sexual relationship in one month or when given the OK by your doctor.  °· Use walker as long as suggested by your caregivers. °· Avoid periods of inactivity such as sitting longer than an hour when not asleep. This helps prevent blood clots.  °You may put full weight on your legs and walk as much as is comfortable.  °You may return to work once you are cleared by your doctor.  °Do not drive a car for 6 weeks or until released by you surgeon.  °· Do not drive  while taking narcotics.  °Wear the elastic stockings for three weeks following surgery during the day but you may remove then at night. °Make sure you keep all of your appointments after your operation with all of your doctors and caregivers. You should call the office at the above phone number and make an appointment for approximately two weeks after the date of your surgery. °Do not remove your surgical dressing. The dressing is waterproof; you may take showers in 3 days, but do not take tub baths or submerge the dressing. °Please pick up a stool softener and laxative for home use as long as you are requiring pain medications. °· ICE to the affected knee every three hours for 30 minutes at a time and then as needed for pain and swelling.  Continue to use ice on the knee for pain and swelling from surgery. You may notice swelling that will progress down to the foot and ankle.  This is normal after surgery.  Elevate the leg when you are not up walking on it.   °It is important for you to complete the blood thinner medication as prescribed by your doctor. °· Continue to use the breathing machine which will help keep your temperature down.  It is common for your temperature to cycle up and down following surgery, especially at night when you are not up moving around and exerting yourself.  The breathing machine keeps your lungs expanded and your temperature down. ° °RANGE OF MOTION AND STRENGTHENING EXERCISES  °Rehabilitation of the knee is important following   a knee injury or an operation. After just a few days of immobilization, the muscles of the thigh which control the knee become weakened and shrink (atrophy). Knee exercises are designed to build up the tone and strength of the thigh muscles and to improve knee motion. Often times heat used for twenty to thirty minutes before working out will loosen up your tissues and help with improving the range of motion but do not use heat for the first two weeks following  surgery. These exercises can be done on a training (exercise) mat, on the floor, on a table or on a bed. Use what ever works the best and is most comfortable for you Knee exercises include:  °Leg Lifts - While your knee is still immobilized in a splint or cast, you can do straight leg raises. Lift the leg to 60 degrees, hold for 3 sec, and slowly lower the leg. Repeat 10-20 times 2-3 times daily. Perform this exercise against resistance later as your knee gets better.  °Quad and Hamstring Sets - Tighten up the muscle on the front of the thigh (Quad) and hold for 5-10 sec. Repeat this 10-20 times hourly. Hamstring sets are done by pushing the foot backward against an object and holding for 5-10 sec. Repeat as with quad sets.  °A rehabilitation program following serious knee injuries can speed recovery and prevent re-injury in the future due to weakened muscles. Contact your doctor or a physical therapist for more information on knee rehabilitation.  ° °SKILLED REHAB INSTRUCTIONS: °If the patient is transferred to a skilled rehab facility following release from the hospital, a list of the current medications will be sent to the facility for the patient to continue.  When discharged from the skilled rehab facility, please have the facility set up the patient's Home Health Physical Therapy prior to being released. Also, the skilled facility will be responsible for providing the patient with their medications at time of release from the facility to include their pain medication, the muscle relaxants, and their blood thinner medication. If the patient is still at the rehab facility at time of the two week follow up appointment, the skilled rehab facility will also need to assist the patient in arranging follow up appointment in our office and any transportation needs. ° °MAKE SURE YOU:  °Understand these instructions.  °Will watch your condition.  °Will get help right away if you are not doing well or get worse.   ° ° °Pick up stool softner and laxative for home use following surgery while on pain medications. °Do NOT remove your dressing. You may shower.  °Do not take tub baths or submerge incision under water. °May shower starting three days after surgery. °Please use a clean towel to pat the incision dry following showers. °Continue to use ice for pain and swelling after surgery. °Do not use any lotions or creams on the incision until instructed by your surgeon. ° °

## 2017-02-11 NOTE — Progress Notes (Signed)
AssistedDr. Edwards with right, ultrasound guided, adductor canal block. Side rails up, monitors on throughout procedure. See vital signs in flow sheet. Tolerated Procedure well.  

## 2017-02-11 NOTE — Anesthesia Procedure Notes (Signed)
Procedure Name: MAC Date/Time: 02/11/2017 7:36 AM Performed by: Lissa Morales Pre-anesthesia Checklist: Patient identified, Emergency Drugs available, Suction available and Patient being monitored Patient Re-evaluated:Patient Re-evaluated prior to induction Number of attempts: 1 Placement Confirmation: positive ETCO2 Dental Injury: Teeth and Oropharynx as per pre-operative assessment

## 2017-02-11 NOTE — Anesthesia Procedure Notes (Signed)
Procedure Name: Intubation Date/Time: 02/11/2017 8:09 AM Performed by: Lissa Morales Pre-anesthesia Checklist: Patient identified, Emergency Drugs available, Suction available and Patient being monitored Patient Re-evaluated:Patient Re-evaluated prior to induction Oxygen Delivery Method: Circle system utilized Preoxygenation: Pre-oxygenation with 100% oxygen Induction Type: IV induction Ventilation: Mask ventilation without difficulty Laryngoscope Size: Mac and 4 Grade View: Grade I Tube type: Oral Tube size: 7.5 mm Number of attempts: 1 Airway Equipment and Method: Stylet and Oral airway Placement Confirmation: ETT inserted through vocal cords under direct vision,  positive ETCO2 and breath sounds checked- equal and bilateral Secured at: 21 cm Tube secured with: Tape Dental Injury: Teeth and Oropharynx as per pre-operative assessment

## 2017-02-11 NOTE — Anesthesia Procedure Notes (Addendum)
Spinal  Patient location during procedure: OR Staffing Anesthesiologist: Belinda Block Other anesthesia staff: Enrigue Catena E Performed: anesthesiologist  Preanesthetic Checklist Completed: patient identified, site marked, surgical consent, pre-op evaluation, timeout performed, IV checked, risks and benefits discussed and monitors and equipment checked Spinal Block Patient position: sitting Prep: ChloraPrep Patient monitoring: heart rate, continuous pulse ox and blood pressure Location: L3-4 Needle Needle type: Sprotte  Needle gauge: 24 G Additional Notes CRNA and Dr. Nyoka Cowden attempted spinal unsuccessfuly. Last attempt by Dr. Nyoka Cowden  Pt experienced paresthesia with 22 gauge spinal needle. Proceeded with  GA. Prior to  Judith Nelson pt stated no more paresthsia or tingling in legs

## 2017-02-11 NOTE — Evaluation (Signed)
Physical Therapy Evaluation Patient Details Name: Judith Nelson MRN: [id number] DOB: [birth year] Today's Date: 02/11/2017   History of Present Illness  Pt s/p R TKR  Clinical Impression  Pt s/p R TKR and presents with decreased R LE strength/ROM and post op pain limiting functional mobility.  Pt should progress to dc home with family assist.    Follow Up Recommendations DC plan and follow up therapy as arranged by surgeon    Equipment Recommendations  None recommended by PT    Recommendations for Other Services OT consult     Precautions / Restrictions Precautions Precautions: Fall Restrictions Weight Bearing Restrictions: No Other Position/Activity Restrictions: WBAT      Mobility  Bed Mobility Overal bed mobility: Needs Assistance Bed Mobility: Supine to Sit     Supine to sit: Min assist     General bed mobility comments: cues for sequence and use of L LE to self assist  Transfers Overall transfer level: Needs assistance Equipment used: Rolling walker (2 wheeled) Transfers: Sit to/from Stand Sit to Stand: Min assist         General transfer comment: cues for LE management and use of UEs to self assist  Ambulation/Gait Ambulation/Gait assistance: Min assist Ambulation Distance (Feet): 38 Feet Assistive device: Rolling walker (2 wheeled) Gait Pattern/deviations: Step-to pattern;Decreased step length - right;Decreased step length - left;Shuffle;Trunk flexed Gait velocity: decr Gait velocity interpretation: Below normal speed for age/gender General Gait Details: cues for sequence, posture and position from [employer]            Wheelchair Mobility    Modified Rankin (Stroke Patients Only)       Balance                                             Pertinent Vitals/Pain Pain Assessment: 0-10 Pain Score: 4  Pain Location: R knee Pain Descriptors / Indicators: Aching;Sore Pain Intervention(s): Limited activity within  patient's tolerance;Monitored during session;Premedicated before session;Ice applied    Home Living Family/patient expects to be discharged to:: Private residence Living Arrangements: Spouse/significant other Available Help at Discharge: Family Type of Home: House Home Access: Stairs to enter Entrance Stairs-Rails: Right Entrance Stairs-Number of Steps: 3 Home Layout: One level Home Equipment: Environmental consultant - 2 wheels;Crutches      Prior Function Level of Independence: Independent               Hand Dominance        Extremity/Trunk Assessment   Upper Extremity Assessment Upper Extremity Assessment: Overall WFL for tasks assessed    Lower Extremity Assessment Lower Extremity Assessment: RLE deficits/detail    Cervical / Trunk Assessment Cervical / Trunk Assessment: Normal  Communication   Communication: No difficulties  Cognition Arousal/Alertness: Awake/alert Behavior During Therapy: WFL for tasks assessed/performed Overall Cognitive Status: Within Functional Limits for tasks assessed                                        General Comments      Exercises Total Joint Exercises Ankle Circles/Pumps: AROM;Both;15 reps;Supine   Assessment/Plan    PT Assessment Patient needs continued PT services  PT Problem List Decreased strength;Decreased range of motion;Decreased activity tolerance;Decreased mobility;Decreased knowledge of use of DME;Obesity;Pain       PT  Treatment Interventions DME instruction;Gait training;Stair training;Functional mobility training;Therapeutic activities;Therapeutic exercise;Patient/family education    PT Goals (Current goals can be found in the Care Plan section)  Acute Rehab PT Goals Patient Stated Goal: Walk without pain PT Goal Formulation: With patient Time For Goal Achievement: 02/14/17 Potential to Achieve Goals: Good    Frequency 7X/week   Barriers to discharge        Co-evaluation                AM-PAC PT "6 Clicks" Daily Activity  Outcome Measure Difficulty turning over in bed (including adjusting bedclothes, sheets and blankets)?: Unable Difficulty moving from lying on back to sitting on the side of the bed? : Unable Difficulty sitting down on and standing up from a chair with arms (e.g., wheelchair, bedside commode, etc,.)?: Unable Help needed moving to and from a bed to chair (including a wheelchair)?: A Little Help needed walking in hospital room?: A Little Help needed climbing 3-5 steps with a railing? : A Little 6 Click Score: 12    End of Session Equipment Utilized During Treatment: Gait belt Activity Tolerance: Patient tolerated treatment well Patient left: in chair;with call bell/phone within reach Nurse Communication: Mobility status PT Visit Diagnosis: Difficulty in walking, not elsewhere classified (R26.2)    Time: 5643-3295 PT Time Calculation (min) (ACUTE ONLY): 24 min   Charges:   PT Evaluation $PT Eval Low Complexity: 1 Low PT Treatments $Gait Training: 8-22 mins   PT G Codes:        Pg [phone]   Avannah Decker 02/11/2017, 4:51 PM

## 2017-02-11 NOTE — Anesthesia Procedure Notes (Addendum)
Anesthesia Regional Block: Adductor canal block   Pre-Anesthetic Checklist: ,, timeout performed, Correct Patient, Correct Site, Correct Laterality, Correct Procedure, Correct Position, site marked, Risks and benefits discussed,  Surgical consent,  Pre-op evaluation,  At surgeon's request and post-op pain management  Laterality: Right  Prep: chloraprep       Needles:   Needle Type: Stimulator Needle - 80          Additional Needles:   Procedures: Doppler guided,,,, ultrasound used (permanent image in chart),,,,  Narrative:  Start time: 02/11/2017 6:55 AM End time: 02/11/2017 7:10 AM Injection made incrementally with aspirations every 5 mL.  Performed by: Personally  Anesthesiologist: Belinda Block

## 2017-02-12 DIAGNOSIS — M1711 Unilateral primary osteoarthritis, right knee: Secondary | ICD-10-CM | POA: Diagnosis not present

## 2017-02-12 LAB — CBC
HCT: 32.5 % — ABNORMAL LOW (ref 36.0–46.0)
HEMOGLOBIN: 11.4 g/dL — AB (ref 12.0–15.0)
MCH: 30.6 pg (ref 26.0–34.0)
MCHC: 35.1 g/dL (ref 30.0–36.0)
MCV: 87.1 fL (ref 78.0–100.0)
PLATELETS: 222 10*3/uL (ref 150–400)
RBC: 3.73 MIL/uL — AB (ref 3.87–5.11)
RDW: 13.4 % (ref 11.5–15.5)
WBC: 13.3 10*3/uL — AB (ref 4.0–10.5)

## 2017-02-12 LAB — BASIC METABOLIC PANEL
Anion gap: 8 (ref 5–15)
BUN: 8 mg/dL (ref 6–20)
CALCIUM: 8.4 mg/dL — AB (ref 8.9–10.3)
CO2: 26 mmol/L (ref 22–32)
CREATININE: 0.62 mg/dL (ref 0.44–1.00)
Chloride: 105 mmol/L (ref 101–111)
GFR calc Af Amer: >60 mL/min (ref >60)
GLUCOSE: 122 mg/dL — AB (ref 65–99)
Potassium: 4.2 mmol/L (ref 3.5–5.1)
Sodium: 139 mmol/L (ref 135–145)

## 2017-02-12 MED ORDER — ONDANSETRON HCL 4 MG PO TABS: 4.0000 mg | ORAL_TABLET | Freq: Four times a day (QID) | ORAL | 0 refills | Status: DC | PRN

## 2017-02-12 MED ORDER — POLYETHYLENE GLYCOL 3350 17 G PO PACK: 17.0000 g | PACK | Freq: Every day | ORAL | 0 refills | Status: DC | PRN

## 2017-02-12 MED ORDER — SENNA 8.6 MG PO TABS: 2.0000 | ORAL_TABLET | Freq: Every day | ORAL | 0 refills | Status: DC

## 2017-02-12 MED ORDER — DOCUSATE SODIUM 100 MG PO CAPS: 100.0000 mg | ORAL_CAPSULE | Freq: Two times a day (BID) | ORAL | 1 refills | Status: DC

## 2017-02-12 MED ORDER — OXYCODONE HCL ER 10 MG PO T12A: 10.0000 mg | EXTENDED_RELEASE_TABLET | ORAL | 0 refills | Status: DC

## 2017-02-12 MED ORDER — ASPIRIN 81 MG PO CHEW: 81.0000 mg | CHEWABLE_TABLET | Freq: Two times a day (BID) | ORAL | 1 refills | Status: DC

## 2017-02-12 MED ORDER — HYDROCODONE-ACETAMINOPHEN 5-325 MG PO TABS: 1.0000 | ORAL_TABLET | ORAL | 0 refills | Status: DC | PRN

## 2017-02-12 NOTE — Evaluation (Signed)
Occupational Therapy Evaluation Patient Details Name: RHIANN BOUCHER MRN: [id number] DOB: [birth year] Today's Date: 02/12/2017    History of Present Illness Pt s/p R TKR   Clinical Impression   OT education complete.     Follow Up Recommendations  No OT follow up    Equipment Recommendations  None recommended by OT    Recommendations for Other Services       Precautions / Restrictions Restrictions Weight Bearing Restrictions: No      Mobility Bed Mobility               General bed mobility comments: pt in the chair  Transfers Overall transfer level: Needs assistance Equipment used: Rolling walker (2 wheeled) Transfers: Sit to/from Stand Sit to Stand: Supervision              Balance                                           ADL either performed or assessed with clinical judgement   ADL Overall ADL's : Needs assistance/impaired Eating/Feeding: Set up;Sitting   Grooming: Set up;Standing   Upper Body Bathing: Set up;Sitting   Lower Body Bathing: Minimal assistance;Sit to/from stand;Cueing for safety;Cueing for sequencing;Cueing for compensatory techniques   Upper Body Dressing : Set up;Sitting   Lower Body Dressing: Minimal assistance;Sit to/from stand;Cueing for safety;Cueing for sequencing   Toilet Transfer: Minimal assistance;RW;Comfort height toilet   Toileting- Clothing Manipulation and Hygiene: Min guard;Sit to/from stand;Cueing for safety;Cueing for sequencing;Cueing for compensatory techniques     Tub/Shower Transfer Details (indicate cue type and reason): verbalized safety         Vision Patient Visual Report: No change from baseline       Perception     Praxis      Pertinent Vitals/Pain Pain Score: 3  Pain Location: R knee Pain Descriptors / Indicators: Aching;Sore Pain Intervention(s): Limited activity within patient's tolerance;Monitored during session;Ice applied     Hand Dominance      Extremity/Trunk Assessment Upper Extremity Assessment Upper Extremity Assessment: Overall WFL for tasks assessed           Communication Communication Communication: No difficulties   Cognition Arousal/Alertness: Awake/alert Behavior During Therapy: WFL for tasks assessed/performed Overall Cognitive Status: Within Functional Limits for tasks assessed                                     General Comments       Exercises     Shoulder Instructions      Home Living Family/patient expects to be discharged to:: Private residence Living Arrangements: Spouse/significant other Available Help at Discharge: Family Type of Home: House Home Access: Stairs to enter Technical brewer of Steps: 3 Entrance Stairs-Rails: Right Home Layout: One level     Bathroom Shower/Tub: Tub/shower unit         Home Equipment: Environmental consultant - 2 wheels;Crutches          Prior Functioning/Environment Level of Independence: Independent                 OT Problem List: Decreased strength      OT Treatment/Interventions:      OT Goals(Current goals can be found in the care plan section) Acute Rehab OT Goals Patient Stated Goal: Walk without pain  OT Frequency:     Barriers to D/C:            Co-evaluation              AM-PAC PT "6 Clicks" Daily Activity     Outcome Measure Help from another person eating meals?: None Help from another person taking care of personal grooming?: None Help from another person toileting, which includes using toliet, bedpan, or urinal?: A Little Help from another person bathing (including washing, rinsing, drying)?: A Little Help from another person to put on and taking off regular upper body clothing?: None Help from another person to put on and taking off regular lower body clothing?: A Little 6 Click Score: 21   End of Session Equipment Utilized During Treatment: Rolling walker Nurse Communication: Mobility  status  Activity Tolerance: Patient tolerated treatment well Patient left: in chair;with call bell/phone within reach;with family/visitor present  OT Visit Diagnosis: Muscle weakness (generalized) (M62.81)                Time: 7867-5449 OT Time Calculation (min): 24 min Charges:  OT General Charges $OT Visit: 1 Visit OT Evaluation $OT Eval Low Complexity: 1 Low OT Treatments $Self Care/Home Management : 8-22 mins G-Codes: OT G-codes **NOT FOR INPATIENT CLASS** Functional Assessment Tool Used: Clinical judgement Functional Limitation: Self care Self Care Current Status (E0100): At least 20 percent but less than 40 percent impaired, limited or restricted Self Care Goal Status (F1219): At least 1 percent but less than 20 percent impaired, limited or restricted Self Care Discharge Status [phone]): At least 20 percent but less than 40 percent impaired, limited or restricted   Kari Baars, [place]  Payton Mccallum D 02/12/2017, 10:18 AM

## 2017-02-12 NOTE — Progress Notes (Signed)
Physical Therapy Treatment Patient Details Name: Judith Nelson MRN: [id number] DOB: 1955-12-25 Today's Date: 02/12/2017    History of Present Illness Pt s/p R TKR    PT Comments    Pt performed therex program with written program provided for home use until Monday with follow up OPPT.  Pt requests ambulation/stairs training after arrival of spouse.   Follow Up Recommendations  DC plan and follow up therapy as arranged by surgeon     Equipment Recommendations  None recommended by PT    Recommendations for Other Services OT consult     Precautions / Restrictions Precautions Precautions: Fall Restrictions Weight Bearing Restrictions: No Other Position/Activity Restrictions: WBAT    Mobility  Bed Mobility               General bed mobility comments: pt in the chair  Transfers Overall transfer level: Needs assistance Equipment used: Rolling walker (2 wheeled) Transfers: Sit to/from Stand Sit to Stand: Supervision            Ambulation/Gait                 Stairs            Wheelchair Mobility    Modified Rankin (Stroke Patients Only)       Balance                                            Cognition Arousal/Alertness: Awake/alert Behavior During Therapy: WFL for tasks assessed/performed Overall Cognitive Status: Within Functional Limits for tasks assessed                                        Exercises Total Joint Exercises Ankle Circles/Pumps: AROM;Both;15 reps;Supine Quad Sets: AROM;Both;10 reps;Supine Heel Slides: AAROM;Right;15 reps;Supine Straight Leg Raises: AAROM;AROM;15 reps;Supine Goniometric ROM: AAROM at R knee -10 - 50    General Comments        Pertinent Vitals/Pain Pain Assessment: 0-10 Pain Score: 4  Pain Location: R knee Pain Descriptors / Indicators: Aching;Sore Pain Intervention(s): Limited activity within patient's tolerance;Monitored during session;Premedicated  before session;Ice applied    Home Living Family/patient expects to be discharged to:: Private residence Living Arrangements: Spouse/significant other Available Help at Discharge: Family Type of Home: House Home Access: Stairs to enter Entrance Stairs-Rails: Right Home Layout: One level Home Equipment: Environmental consultant - 2 wheels;Crutches      Prior Function Level of Independence: Independent          PT Goals (current goals can now be found in the care plan section) Acute Rehab PT Goals Patient Stated Goal: Walk without pain PT Goal Formulation: With patient Time For Goal Achievement: 02/14/17 Potential to Achieve Goals: Good Progress towards PT goals: Progressing toward goals    Frequency    7X/week      PT Plan Current plan remains appropriate    Co-evaluation              AM-PAC PT "6 Clicks" Daily Activity  Outcome Measure  Difficulty turning over in bed (including adjusting bedclothes, sheets and blankets)?: Unable Difficulty moving from lying on back to sitting on the side of the bed? : A Lot Difficulty sitting down on and standing up from a chair with arms (e.g., wheelchair, bedside commode, etc,.)?: A Lot  Help needed moving to and from a bed to chair (including a wheelchair)?: A Little Help needed walking in hospital room?: A Little Help needed climbing 3-5 steps with a railing? : A Little 6 Click Score: 14    End of Session   Activity Tolerance: Patient tolerated treatment well Patient left: in chair;with call bell/phone within reach   PT Visit Diagnosis: Difficulty in walking, not elsewhere classified (R26.2)     Time: 0388-8280 PT Time Calculation (min) (ACUTE ONLY): 22 min  Charges:  $Therapeutic Exercise: 8-22 mins                    G Codes:       Pg [phone]    Cloyde Oregel 02/12/2017, 12:54 PM

## 2017-02-12 NOTE — Progress Notes (Signed)
   Subjective:  Patient reports pain as mild to moderate.  Denies N/V/CP/SOB.  Objective:   VITALS:   Vitals:   02/11/17 1757 02/11/17 2109 02/12/17 0056 02/12/17 0540  BP: (!) 152/82 131/76 121/67 137/77  Pulse: 90 71 64 70  Resp: 18 17 16 16   Temp: 98 F (36.7 C) 97.8 F (36.6 C) 98.3 F (36.8 C) 97.9 F (36.6 C)  TempSrc: Axillary Oral Oral Oral  SpO2: 97% 97% 100% 98%  Weight:      Height:        NAD ABD soft Sensation intact distally Intact pulses distally Dorsiflexion/Plantar flexion intact Incision: dressing C/D/I Compartment soft   Lab Results  Component Value Date   WBC 13.3 (H) 02/12/2017   HGB 11.4 (L) 02/12/2017   HCT 32.5 (L) 02/12/2017   MCV 87.1 02/12/2017   PLT 222 02/12/2017   BMET    Component Value Date/Time   NA 139 02/12/2017 0525   K 4.2 02/12/2017 0525   CL 105 02/12/2017 0525   CO2 26 02/12/2017 0525   GLUCOSE 122 (H) 02/12/2017 0525   BUN 8 02/12/2017 0525   CREATININE 0.62 02/12/2017 0525   CALCIUM 8.4 (L) 02/12/2017 0525   GFRNONAA >60 02/12/2017 0525   GFRAA >60 02/12/2017 0525     Assessment/Plan: 1 Day Post-Op   Principal Problem:   Osteoarthritis of right knee   WBAT with walker ASA, SCDs, TEDs PO pain control PT/OT Dispo: D/C home with outpatient PT   Judith Nelson, Horald Pollen 02/12/2017, 7:59 AM   Rod Can, MD Cell [phone]

## 2017-02-12 NOTE — Progress Notes (Signed)
RN reviewed discharge instructions with patient and family. All questions answered.   Paperwork and prescriptions given.   NT rolled patient down with all belongings to family car. 

## 2017-02-12 NOTE — Progress Notes (Signed)
Physical Therapy Treatment Patient Details Name: Judith Nelson MRN: [id number] DOB: 12-Apr-1956 Today's Date: 02/12/2017    History of Present Illness Pt s/p R TKR    PT Comments    Pt progressing well with mobility and eager for return home.  Reviewed stairs and home therex program.. Pt feels comfortable with abilities and husband's ability to assist as needed.   Follow Up Recommendations  DC plan and follow up therapy as arranged by surgeon     Equipment Recommendations  None recommended by PT    Recommendations for Other Services OT consult     Precautions / Restrictions Precautions Precautions: Fall Restrictions Weight Bearing Restrictions: No Other Position/Activity Restrictions: WBAT    Mobility  Bed Mobility Overal bed mobility: Needs Assistance             General bed mobility comments: Pt OOB and declines back to bed.  States comfortable with ability and spouse's ability to assist  Transfers Overall transfer level: Needs assistance Equipment used: Rolling walker (2 wheeled) Transfers: Sit to/from Stand Sit to Stand: Supervision         General transfer comment: cues for LE management and use of UEs to self assist  Ambulation/Gait Ambulation/Gait assistance: Min assist Ambulation Distance (Feet): 100 Feet (and 15' into bathroom) Assistive device: Rolling walker (2 wheeled) Gait Pattern/deviations: Step-to pattern;Decreased step length - right;Decreased step length - left;Shuffle;Trunk flexed Gait velocity: decr Gait velocity interpretation: Below normal speed for age/gender General Gait Details: cues for sequence, posture and position from RW   Stairs Stairs: Yes   Stair Management: One rail Left;Step to pattern;Forwards;With crutches Number of Stairs: 4 General stair comments: Cues for sequence and foot/crutch placement  Wheelchair Mobility    Modified Rankin (Stroke Patients Only)       Balance                                             Cognition Arousal/Alertness: Awake/alert Behavior During Therapy: WFL for tasks assessed/performed Overall Cognitive Status: Within Functional Limits for tasks assessed                                        Exercises Total Joint Exercises Ankle Circles/Pumps: AROM;Both;15 reps;Supine Quad Sets: AROM;Both;10 reps;Supine Heel Slides: AAROM;Right;15 reps;Supine Straight Leg Raises: AAROM;AROM;15 reps;Supine Goniometric ROM: AAROM at R knee -10 - 50    General Comments        Pertinent Vitals/Pain Pain Assessment: 0-10 Pain Score: 4  Pain Location: R knee Pain Descriptors / Indicators: Aching;Sore Pain Intervention(s): Limited activity within patient's tolerance;Monitored during session;Premedicated before session;Ice applied    Home Living Family/patient expects to be discharged to:: Private residence Living Arrangements: Spouse/significant other Available Help at Discharge: Family Type of Home: House Home Access: Stairs to enter Entrance Stairs-Rails: Right Home Layout: One level Home Equipment: Environmental consultant - 2 wheels;Crutches      Prior Function Level of Independence: Independent          PT Goals (current goals can now be found in the care plan section) Acute Rehab PT Goals Patient Stated Goal: Walk without pain PT Goal Formulation: With patient Time For Goal Achievement: 02/14/17 Potential to Achieve Goals: Good Progress towards PT goals: Progressing toward goals    Frequency    7X/week  PT Plan Current plan remains appropriate    Co-evaluation              AM-PAC PT "6 Clicks" Daily Activity  Outcome Measure  Difficulty turning over in bed (including adjusting bedclothes, sheets and blankets)?: Unable Difficulty moving from lying on back to sitting on the side of the bed? : A Lot Difficulty sitting down on and standing up from a chair with arms (e.g., wheelchair, bedside commode, etc,.)?: A Lot Help  needed moving to and from a bed to chair (including a wheelchair)?: A Little Help needed walking in hospital room?: A Little Help needed climbing 3-5 steps with a railing? : A Little 6 Click Score: 14    End of Session Equipment Utilized During Treatment: Gait belt Activity Tolerance: Patient tolerated treatment well Patient left: in chair;with call bell/phone within reach Nurse Communication: Mobility status PT Visit Diagnosis: Difficulty in walking, not elsewhere classified (R26.2)     Time: 0712-1975 PT Time Calculation (min) (ACUTE ONLY): 25 min  Charges:  $Gait Training: 23-37 mins $Therapeutic Exercise: 8-22 mins                    G Codes:       Pg [phone]    Judith Nelson 02/12/2017, 1:00 PM

## 2017-02-12 NOTE — Discharge Summary (Signed)
Physician Discharge Summary  Patient ID: Judith Nelson MRN: [id number] DOB/AGE: 12-09-1955 61 y.o.  Admit date: 02/11/2017 Discharge date: 02/12/2017  Admission Diagnoses:  Osteoarthritis of right knee  Discharge Diagnoses:  Principal Problem:   Osteoarthritis of right knee   Past Medical History:  Diagnosis Date  . Allergic rhinitis, seasonal 11/14/2015  . Chronic bronchitis ([place])   . 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 Diverticulitis of large intestine with perforation without bleeding 06/18/2016  . Generalized abdominal pain   . GERD (gastroesophageal reflux disease)   . HTN (hypertension) 03/23/2014  . Hypertension   . Morbid obesity with BMI of 40.0-44.9, adult ([place]) 05/29/2015  . Pneumonia   . Preventative health care 05/29/2015  . Reflux   . Urge incontinence 05/29/2015  . URI (upper respiratory infection) 05/29/2015    Surgeries: Procedure(s): RIGHT TOTAL KNEE ARTHROPLASTY WITH COMPUTER NAVIGATION on 02/11/2017   Consultants (if any):   Discharged Condition: Improved  Hospital Course: Judith Nelson is an 61 y.o. female who was admitted 02/11/2017 with a diagnosis of Osteoarthritis of right knee and went to the operating room on 02/11/2017 and underwent the above named procedures.    She was given perioperative antibiotics:  Anti-infectives    Start     Dose/Rate Route Frequency Ordered Stop   02/11/17 1400  ceFAZolin (ANCEF) IVPB 2g/100 mL premix     2 g 200 mL/hr over 30 Minutes Intravenous Every 6 hours 02/11/17 1143 02/11/17 2140   02/11/17 0518  ceFAZolin (ANCEF) IVPB 2g/100 mL premix     2 g 200 mL/hr over 30 Minutes Intravenous On call to O.R. 02/11/17 0518 02/11/17 0800    .  She was given sequential compression devices, early ambulation, and ASA for DVT prophylaxis.  She benefited maximally from the hospital stay and there were no complications.    Recent vital signs:  Vitals:   02/12/17 0056 02/12/17 0540  BP: 121/67 137/77  Pulse: 64 70  Resp: 16 16   Temp: 98.3 F (36.8 C) 97.9 F (36.6 C)  SpO2: 100% 98%    Recent laboratory studies:  Lab Results  Component Value Date   HGB 11.4 (L) 02/12/2017   HGB 11.7 (L) 06/24/2016   HGB 12.6 06/21/2016   Lab Results  Component Value Date   WBC 13.3 (H) 02/12/2017   PLT 222 02/12/2017   No results found for: INR Lab Results  Component Value Date   NA 139 02/12/2017   K 4.2 02/12/2017   CL 105 02/12/2017   CO2 26 02/12/2017   BUN 8 02/12/2017   CREATININE 0.62 02/12/2017   GLUCOSE 122 (H) 02/12/2017    Discharge Medications:   Allergies as of 02/12/2017      Reactions   Hydrocodone-chlorpheniramine Other (See Comments)   Insomnia    Sulfa Antibiotics Other (See Comments)   Fever- had reaction as a child      Medication List    STOP taking these medications   cefdinir 300 MG capsule Commonly known as:  OMNICEF   traMADol 50 MG tablet Commonly known as:  ULTRAM     TAKE these medications   albuterol 108 (90 Base) MCG/ACT inhaler Commonly known as:  PROVENTIL HFA;VENTOLIN HFA Inhale 2 puffs into the lungs every 6 (six) hours as needed for wheezing or shortness of breath.   aspirin 81 MG chewable tablet Chew 1 tablet (81 mg total) by mouth 2 (two) times daily.   CRANBERRY EXTRACT PO Take 15,000 mg by mouth daily.  dextromethorphan-guaiFENesin 30-600 MG 12hr tablet Commonly known as:  MUCINEX DM Take 1 tablet by mouth 2 (two) times daily.   docusate sodium 100 MG capsule Commonly known as:  COLACE Take 1 capsule (100 mg total) by mouth 2 (two) times daily.   esomeprazole 40 MG capsule Commonly known as:  NEXIUM Take 40 mg by mouth daily as needed (for acid reflux/indigestion).   famotidine 20 MG tablet Commonly known as:  PEPCID Take 20 mg by mouth daily as needed for heartburn or indigestion.   HYDROcodone-acetaminophen 5-325 MG tablet Commonly known as:  NORCO/VICODIN Take 1-2 tablets by mouth every 4 (four) hours as needed (breakthrough pain). Do  not exceed 10 tablets per day   loratadine 10 MG tablet Commonly known as:  CLARITIN One daily as needed for itching and sneezing What changed:  how much to take  how to take this  when to take this  reasons to take this  additional instructions   ondansetron 4 MG tablet Commonly known as:  ZOFRAN Take 1 tablet (4 mg total) by mouth every 6 (six) hours as needed for nausea.   oxyCODONE 10 mg 12 hr tablet Commonly known as:  OXYCONTIN Take 1 tablet (10 mg total) by mouth PRO. 1 tab PO every 12 hours for 3 days, then 1 tab PO daily for 4 days   polyethylene glycol packet Commonly known as:  MIRALAX / GLYCOLAX Take 17 g by mouth daily as needed for mild constipation.   senna 8.6 MG Tabs tablet Commonly known as:  SENOKOT Take 2 tablets (17.2 mg total) by mouth at bedtime.   solifenacin 10 MG tablet Commonly known as:  VESICARE Take 10 mg by mouth daily at 2 PM.   Vitamin B-12 5000 MCG Tbdp Take 5,000 Units by mouth daily.   vitamin C 1000 MG tablet Take 2,000 mg by mouth daily.   Vitamin D3 5000 units Tabs Take 5,000 Units by mouth daily.            Discharge Care Instructions        Start     Ordered   02/12/17 0000  aspirin 81 MG chewable tablet  2 times daily     02/12/17 0804   02/12/17 0000  docusate sodium (COLACE) 100 MG capsule  2 times daily     02/12/17 0804   02/12/17 0000  HYDROcodone-acetaminophen (NORCO/VICODIN) 5-325 MG tablet  Every 4 hours PRN     02/12/17 0804   02/12/17 0000  ondansetron (ZOFRAN) 4 MG tablet  Every 6 hours PRN     02/12/17 0804   02/12/17 0000  polyethylene glycol (MIRALAX / GLYCOLAX) packet  Daily PRN     02/12/17 0804   02/12/17 0000  senna (SENOKOT) 8.6 MG TABS tablet  Daily at bedtime     02/12/17 0804   02/12/17 0000  oxyCODONE (OXYCONTIN) 10 mg 12 hr tablet  Protocol     02/12/17 0804   02/12/17 0000  Call MD / Call 911    Comments:  If you experience chest pain or shortness of breath, CALL 911 and be  transported to the hospital emergency room.  If you develope a fever above 101 F, pus (white drainage) or increased drainage or redness at the wound, or calf pain, call your surgeon's office.   02/12/17 0804   02/12/17 0000  Diet - low sodium heart healthy     02/12/17 0804   02/12/17 0000  Constipation Prevention    Comments:  Drink plenty of fluids.  Prune juice may be helpful.  You may use a stool softener, such as Colace (over the counter) 100 mg twice a day.  Use MiraLax (over the counter) for constipation as needed.   02/12/17 0804   02/12/17 0000  Increase activity slowly as tolerated     02/12/17 0804   02/12/17 0000  TED hose    Comments:  Use stockings (TED hose) for 2 weeks on both leg(s).  You may remove them at night for sleeping.   02/12/17 0804   02/12/17 0000  Do not put a pillow under the knee. Place it under the heel.     02/12/17 0804   02/12/17 0000  Driving restrictions    Comments:  No driving for 6 weeks   02/12/17 0804   02/12/17 0000  Lifting restrictions    Comments:  No lifting for 6 weeks   02/12/17 0804      Diagnostic Studies: Dg Knee Right Port  Result Date: 02/11/2017 CLINICAL DATA:  Post right total knee replacement. EXAM: PORTABLE RIGHT KNEE - 1-2 VIEW COMPARISON:  None. FINDINGS: Evidence of patient's totally arthroplasty with prosthetic components intact and normally located. Small amount of fluid within the knee joint. IMPRESSION: Evidence of recent right total knee arthroplasty without complicating features. These results will be called to the ordering clinician or representative by the Radiology Department at the imaging location. Electronically Signed   By: Marin Olp M.D.   On: 02/11/2017 10:58    Disposition: 01-Home or Self Care  Discharge Instructions    Call MD / Call 911    Complete by:  As directed    If you experience chest pain or shortness of breath, CALL 911 and be transported to the hospital emergency room.  If you develope a  fever above 101 F, pus (white drainage) or increased drainage or redness at the wound, or calf pain, call your surgeon's office.   Constipation Prevention    Complete by:  As directed    Drink plenty of fluids.  Prune juice may be helpful.  You may use a stool softener, such as Colace (over the counter) 100 mg twice a day.  Use MiraLax (over the counter) for constipation as needed.   Diet - low sodium heart healthy    Complete by:  As directed    Do not put a pillow under the knee. Place it under the heel.    Complete by:  As directed    Driving restrictions    Complete by:  As directed    No driving for 6 weeks   Increase activity slowly as tolerated    Complete by:  As directed    Lifting restrictions    Complete by:  As directed    No lifting for 6 weeks   TED hose    Complete by:  As directed    Use stockings (TED hose) for 2 weeks on both leg(s).  You may remove them at night for sleeping.      Follow-up Information    Joclynn Lumb, Aaron Edelman, MD. Schedule an appointment as soon as possible for a visit in 2 weeks.   Specialty:  Orthopedic Surgery Why:  For wound re-check Contact information: [address]. [address] [address] [phone]            Signed: Elie Goody 02/12/2017, 4:36 PM

## 2017-02-12 NOTE — Progress Notes (Signed)
Discharge planning, no HH needs identified. Plan for OP PT, has RW and elevated toilets. [phone]

## 2017-02-17 NOTE — Anesthesia Postprocedure Evaluation (Signed)
Anesthesia Post Note  Patient: Judith Nelson  Procedure(s) Performed: Procedure(s) (LRB): RIGHT TOTAL KNEE ARTHROPLASTY WITH COMPUTER NAVIGATION (Right)     Patient location during evaluation: PACU Anesthesia Type: General Level of consciousness: awake Pain management: pain level controlled Vital Signs Assessment: post-procedure vital signs reviewed and stable Respiratory status: spontaneous breathing Postop Assessment: no apparent nausea or vomiting Anesthetic complications: no    Last Vitals:  Vitals:   02/12/17 0056 02/12/17 0540  BP: 121/67 137/77  Pulse: 64 70  Resp: 16 16  Temp: 36.8 C 36.6 C  SpO2: 100% 98%    Last Pain:  Vitals:   02/12/17 0910  TempSrc:   PainSc: 4                  Kendell Gammon

## 2017-09-14 ENCOUNTER — Telehealth: Payer: Self-pay | Admitting: Medical

## 2017-09-14 ENCOUNTER — Ambulatory Visit: Payer: Self-pay | Admitting: Medical

## 2017-09-14 VITALS — BP 155/82 | HR 76 | Temp 98.0°F | Resp 18 | Ht 67.0 in | Wt 259.2 lb

## 2017-09-14 DIAGNOSIS — J301 Allergic rhinitis due to pollen: Secondary | ICD-10-CM

## 2017-09-14 DIAGNOSIS — J45909 Unspecified asthma, uncomplicated: Secondary | ICD-10-CM

## 2017-09-14 MED ORDER — FLUTICASONE-SALMETEROL 250-50 MCG/DOSE IN AEPB: 1.0000 | INHALATION_SPRAY | Freq: Two times a day (BID) | RESPIRATORY_TRACT | 0 refills | Status: DC

## 2017-09-14 MED ORDER — ALBUTEROL SULFATE HFA 108 (90 BASE) MCG/ACT IN AERS: 2.0000 | INHALATION_SPRAY | Freq: Four times a day (QID) | RESPIRATORY_TRACT | 0 refills | Status: DC | PRN

## 2017-09-14 NOTE — Progress Notes (Signed)
   Subjective:    Patient ID: Judith Nelson, female    DOB: [birth year], 62 y.o.   MRN: [id number]  HPI 62 yo female  In non acute distress,  worked outside all weekend hsitory of seasonal allergies and  not on allergy medication.  Out of Advair. Using Albuterol MDI exp 2/18. No fever or chills, shortness breath "feels like allergies" no chest pain.  Using Albuterol MDI at night and in the morning. Needing a refill on Albuterol MDI and Advair. Removed tick from Right under right breast.  Review of Systems  Constitutional: Negative for chills, fatigue and fever.  HENT: Positive for congestion, postnasal drip, rhinorrhea, sinus pressure (Friday  with the barometric change), sneezing and voice change (deeper). Negative for ear pain, sinus pain and sore throat.   Eyes: Negative for discharge (clear tearing) and itching.  Respiratory: Positive for cough, chest tightness and shortness of breath. Negative for wheezing.   Cardiovascular: Negative for chest pain, palpitations and leg swelling.  Gastrointestinal: Negative for abdominal pain.  Endocrine: Negative for cold intolerance and heat intolerance.  Genitourinary: Negative for dysuria.  Musculoskeletal: Negative for myalgias.  Skin: Negative for rash.  Allergic/Immunologic: Positive for environmental allergies. Negative for food allergies.  Neurological: Negative for dizziness and light-headedness.  Psychiatric/Behavioral: Negative for behavioral problems, self-injury and suicidal ideas. The patient is not nervous/anxious.        Objective:   Physical Exam  Constitutional: She is oriented to person, place, and time. She appears well-developed and well-nourished.  HENT:  Head: Normocephalic and atraumatic.  Right Ear: External ear normal.  Left Ear: External ear normal.  Nose: Rhinorrhea (vascular looking anteriorly) present.  Mouth/Throat: Oropharynx is clear and moist.  Eyes: Pupils are equal, round, and reactive to light.  Conjunctivae and EOM are normal.  Neck: Normal range of motion. Neck supple.  Cardiovascular: Normal rate, regular rhythm and normal heart sounds.  Pulmonary/Chest: Effort normal and breath sounds normal.  Lymphadenopathy:    She has no cervical adenopathy.  Neurological: She is alert and oriented to person, place, and time.  Skin: Skin is warm and dry.  Psychiatric: She has a normal mood and affect. Her behavior is normal. Judgment and thought content normal.  Nursing note and vitals reviewed.         Assessment & Plan:   Seasonal allergies,Seasonal Asthma Allergic Rhinitis Recommended restarting Claritin , suggested OTC Flonase however  she does not like to use nasal sprays. Meds ordered this encounter  Medications  . Fluticasone-Salmeterol (ADVAIR DISKUS) 250-50 MCG/DOSE AEPB    Sig: Inhale 1 puff into the lungs 2 (two) times daily.    Dispense:  1 each    Refill:  0  . albuterol (PROVENTIL HFA;VENTOLIN HFA) 108 (90 Base) MCG/ACT inhaler    Sig: Inhale 2 puffs into the lungs every 6 (six) hours as needed for wheezing or shortness of breath.    Dispense:  1 Inhaler    Refill:  0  Reviewed prescribing medication with Dr. Rosanna Randy he was fine with the plan. Return to the clinic in 3-5 days if not improving. Patient is to follow up with her doctor and  Be evaluated for Asthma.  Patient verbalizes understanding and has no questions at discharge.

## 2017-09-14 NOTE — Telephone Encounter (Signed)
Patient returned my call , she says tick bite is not even red  located under right breast. She will come see me if she has any concerns. Reviewed symptoms of lyme disease ( flu like symptoms, fever, body aches, localized red / bullseye area) and RMSF ( rash , fever , chills. headache, n/v or neruological changes) and if these occur to return to the clinic for further evaluation. She verbalizes understanding and has no questions at the end of our conversation.

## 2017-09-14 NOTE — Patient Instructions (Signed)

## 2017-09-14 NOTE — Telephone Encounter (Signed)
Need to discuss her tick bite and if she wants me to evaluate it.

## 2017-09-30 ENCOUNTER — Encounter: Payer: Self-pay | Admitting: Adult Health

## 2017-09-30 ENCOUNTER — Ambulatory Visit: Payer: Self-pay | Admitting: Adult Health

## 2017-09-30 VITALS — BP 157/87 | HR 70 | Temp 98.4°F | Resp 16 | Ht 67.0 in | Wt 262.0 lb

## 2017-09-30 DIAGNOSIS — R82998 Other abnormal findings in urine: Secondary | ICD-10-CM

## 2017-09-30 DIAGNOSIS — R399 Unspecified symptoms and signs involving the genitourinary system: Secondary | ICD-10-CM

## 2017-09-30 LAB — POCT URINALYSIS DIPSTICK
Appearance: NORMAL
GLUCOSE UA: NEGATIVE
KETONES UA: NEGATIVE
Nitrite, UA: NEGATIVE
PH UA: 6 (ref 5.0–8.0)
PROTEIN UA: NEGATIVE
SPEC GRAV UA: 1.01 (ref 1.010–1.025)
Urobilinogen, UA: 0.2 E.U./dL

## 2017-09-30 MED ORDER — CIPROFLOXACIN HCL 500 MG PO TABS: 500.0000 mg | ORAL_TABLET | Freq: Two times a day (BID) | ORAL | 0 refills | Status: AC

## 2017-09-30 NOTE — Progress Notes (Signed)
Patient on AZO as well.  Skewed results - sent for culture 09/30/17  On Cipro as of 09/30/17

## 2017-09-30 NOTE — Progress Notes (Signed)
Subjective:     Patient ID: Judith Nelson, female   DOB: [birth year], 62 y.o.   MRN: [id number]  HPI   Blood pressure (!) 157/87, pulse 70, temperature 98.4 F (36.9 C), resp. rate 16, height 5\' 7"  (1.702 m), weight 262 lb (118.8 kg), SpO2 98 %.  Patient is a 62 year old female in no acute distress who comes to the clinic for urinary tract symptoms that started 09/28/17 on Tuesday.  Denies any abdominal pain.  Patient  denies any fever, body aches,chills, rash, chest pain, shortness of breath, nausea, vomiting, or diarrhea.    No LMP recorded. Patient has had an ablation.  Total Knee replacement 2018   Review of Systems  Constitutional: Positive for chills (x 2 days ). Negative for activity change, appetite change, diaphoresis, fatigue, fever and unexpected weight change.  HENT: Negative.   Respiratory: Negative.   Cardiovascular: Negative.   Gastrointestinal: Negative.   Genitourinary: Positive for dysuria and frequency. Negative for decreased urine volume, difficulty urinating, dyspareunia, enuresis, flank pain, genital sores, hematuria (on dipstick - on AZO as well ), menstrual problem, pelvic pain, urgency, vaginal bleeding, vaginal discharge and vaginal pain.       Pressure with urinating   Musculoskeletal: Negative.   Skin: Negative.   Hematological: Negative.   Psychiatric/Behavioral: Negative.        Objective:   Physical Exam  Constitutional: She is oriented to person, place, and time. She appears well-developed and well-nourished. No distress.  Patient is alert and oriented and responsive to questions Engages in eye contact with provider. Speaks in full sentences without any pauses without any shortness of breath.  Patient moves on and off of exam table and in room without difficulty. Gait is normal in hall and in room. Patient is oriented to person place time and situation. Patient answers questions appropriately and engages in conversation.   HENT:  Head:  Normocephalic and atraumatic.  Right Ear: External ear normal.  Left Ear: External ear normal.  Nose: Nose normal.  Mouth/Throat: Oropharynx is clear and moist. No oropharyngeal exudate.  Eyes: Pupils are equal, round, and reactive to light. Conjunctivae and EOM are normal.  Neck: Normal range of motion. Neck supple.  Cardiovascular: Normal rate, regular rhythm, normal heart sounds and intact distal pulses. Exam reveals no gallop and no friction rub.  No murmur heard. Pulmonary/Chest: Effort normal and breath sounds normal.  Abdominal: Soft. Bowel sounds are normal.  Genitourinary:  Genitourinary Comments: No gynecology exams done in this office currently and no equipment available. Patient is aware she will have to see gynecology if needed for any pelvic/vaginal exam and will follow up with gynecology/obgyn as needed. Yearly gynecology pelvic exam recommended. Patient verbalized understanding of instructions and denies any further questions at this time.    Musculoskeletal: Normal range of motion.  Neurological: She is alert and oriented to person, place, and time.  Skin: Skin is warm and dry. Capillary refill takes less than 2 seconds. No rash noted. She is not diaphoretic. No erythema. No pallor.  Psychiatric: She has a normal mood and affect. Her behavior is normal. Judgment and thought content normal.  Vitals reviewed.      Assessment:     Urinary symptom or sign - Plan: POCT urinalysis dipstick, Urine Culture  Leukocytes in urine      Plan:     Meds ordered this encounter  Medications  . ciprofloxacin (CIPRO) 500 MG tablet    Sig: Take 1 tablet (500 mg  total) by mouth 2 (two) times daily for 7 days.    Dispense:  14 tablet    Refill:  0   Results for orders placed or performed in visit on 09/30/17 (from the past 24 hour(s))  POCT urinalysis dipstick     Status: Abnormal   Collection Time: 09/30/17 10:38 AM  Result Value Ref Range   Color, UA yellow    Clarity, UA  clear    Glucose, UA negative    Bilirubin, UA     Ketones, UA negative    Spec Grav, UA 1.010 1.010 - 1.025   Blood, UA moderate    pH, UA 6.0 5.0 - 8.0   Protein, UA negative    Urobilinogen, UA 0.2 0.2 or 1.0 E.U./dL   Nitrite, UA negative    Leukocytes, UA Large (3+) (A) Negative   Appearance normal    Odor none        Orders Placed This Encounter  Procedures  . Urine Culture  . POCT urinalysis dipstick  She reports two urinay tract infections usually yearly - she does reports seeing urology in past- recommended that she see them again if repeat infections continue.   Repeat urine dipstick for clearance of abnormal's 3- 5 days after finishing antibiotic at time of follow up visit. 2 1/2 week follow up visit to be scheduled by patient.  Advised patient call the office or your primary care doctor for an appointment if no improvement within 72 hours or if any symptoms change or worsen at any time  Advised ER or urgent Care if after hours or on weekend. Call 911 for emergency symptoms at any time.Patinet verbalized understanding of all instructions given/reviewed and treatment plan and has no further questions or concerns at this time.    Patient verbalized understanding of all instructions given and denies any further questions at this time.

## 2017-09-30 NOTE — Patient Instructions (Addendum)
Ciprofloxacin tablets What is this medicine? CIPROFLOXACIN (sip roe FLOX a sin) is a quinolone antibiotic. It is used to treat certain kinds of bacterial infections. It will not work for colds, flu, or other viral infections. This medicine may be used for other purposes; ask your health care provider or pharmacist if you have questions. COMMON BRAND NAME(S): Cipro What should I tell my health care provider before I take this medicine? They need to know if you have any of these conditions: -bone problems -history of low levels of potassium in the blood -joint problems -irregular heartbeat -kidney disease -myasthenia gravis -seizures -tendon problems -tingling of the fingers or toes, or other nerve disorder -an unusual or allergic reaction to ciprofloxacin, other antibiotics or medicines, foods, dyes, or preservatives -pregnant or trying to get pregnant -breast-feeding How should I use this medicine? Take this medicine by mouth with a glass of water. Follow the directions on the prescription label. Take your medicine at regular intervals. Do not take your medicine more often than directed. Take all of your medicine as directed even if you think your are better. Do not skip doses or stop your medicine early. You can take this medicine with food or on an empty stomach. It can be taken with a meal that contains dairy or calcium, but do not take it alone with a dairy product, like milk or yogurt or calcium-fortified juice. A special MedGuide will be given to you by the pharmacist with each prescription and refill. Be sure to read this information carefully each time. Talk to your pediatrician regarding the use of this medicine in children. Special care may be needed. Overdosage: If you think you have taken too much of this medicine contact a poison control center or emergency room at once. NOTE: This medicine is only for you. Do not share this medicine with others. What if I miss a dose? If you  miss a dose, take it as soon as you can. If it is almost time for your next dose, take only that dose. Do not take double or extra doses. What may interact with this medicine? Do not take this medicine with any of the following medications: -cisapride -dofetilide -dronedarone -flibanserin -lomitapide -pimozide -thioridazine -tizanidine -ziprasidone This medicine may also interact with the following medications: -antacids -birth control pills -caffeine -certain medicines for diabetes, like glipizide or glyburide -certain medicines that treat or prevent blood clots like warfarin -clozapine -cyclosporine -didanosine (ddI) buffered tablets or powder -duloxetine -lanthanum carbonate -lidocaine -methotrexate -multivitamins -NSAIDS, medicines for pain and inflammation, like ibuprofen or naproxen -olanzapine -omeprazole -other medicines that prolong the QT interval (cause an abnormal heart rhythm) -phenytoin -probenecid -ropinirole -sevelamer -sildenafil -sucralfate -theophylline -zolpidem This list may not describe all possible interactions. Give your health care provider a list of all the medicines, herbs, non-prescription drugs, or dietary supplements you use. Also tell them if you smoke, drink alcohol, or use illegal drugs. Some items may interact with your medicine. What should I watch for while using this medicine? Tell your doctor or health care professional if your symptoms do not improve. Do not treat diarrhea with over the counter products. Contact your doctor if you have diarrhea that lasts more than 2 days or if it is severe and watery. You may get drowsy or dizzy. Do not drive, use machinery, or do anything that needs mental alertness until you know how this medicine affects you. Do not stand or sit up quickly, especially if you are an older patient. This reduces  the risk of dizzy or fainting spells. This medicine can make you more sensitive to the sun. Keep out of the  sun. If you cannot avoid being in the sun, wear protective clothing and use sunscreen. Do not use sun lamps or tanning beds/booths. Avoid antacids, aluminum, calcium, iron, magnesium, and zinc products for 6 hours before and 2 hours after taking a dose of this medicine. What side effects may I notice from receiving this medicine? Side effects that you should report to your doctor or health care professional as soon as possible: -allergic reactions like skin rash or hives, swelling of the face, lips, or tongue -anxious -confusion -depressed mood -diarrhea -fast, irregular heartbeat -hallucination, loss of contact with reality -joint, muscle, or tendon pain or swelling -pain, tingling, numbness in the hands or feet -suicidal thoughts or other mood changes -sunburn -unusually weak or tired Side effects that usually do not require medical attention (report to your doctor or health care professional if they continue or are bothersome): -dry mouth -headache -nausea -trouble sleeping This list may not describe all possible side effects. Call your doctor for medical advice about side effects. You may report side effects to FDA at 1-800-FDA-1088. Where should I keep my medicine? Keep out of the reach of children. Store at room temperature below 30 degrees C (86 degrees F). Keep container tightly closed. Throw away any unused medicine after the expiration date. NOTE: This sheet is a summary. It may not cover all possible information. If you have questions about this medicine, talk to your doctor, pharmacist, or health care provider.  2018 Elsevier/Gold Standard (2015-12-20 14:42:02) Urinary Tract Infection, Adult A urinary tract infection (UTI) is an infection of any part of the urinary tract. The urinary tract includes the:  Kidneys.  Ureters.  Bladder.  Urethra.  These organs make, store, and get rid of pee (urine) in the body. Follow these instructions at home:  Take  over-the-counter and prescription medicines only as told by your doctor.  If you were prescribed an antibiotic medicine, take it as told by your doctor. Do not stop taking the antibiotic even if you start to feel better.  Avoid the following drinks: ? Alcohol. ? Caffeine. ? Tea. ? Carbonated drinks.  Drink enough fluid to keep your pee clear or pale yellow.  Keep all follow-up visits as told by your doctor. This is important.  Make sure to: ? Empty your bladder often and completely. Do not to hold pee for long periods of time. ? Empty your bladder before and after sex. ? Wipe from front to back after a bowel movement if you are female. Use each tissue one time when you wipe. Contact a doctor if:  You have back pain.  You have a fever.  You feel sick to your stomach (nauseous).  You throw up (vomit).  Your symptoms do not get better after 3 days.  Your symptoms go away and then come back. Get help right away if:  You have very bad back pain.  You have very bad lower belly (abdominal) pain.  You are throwing up and cannot keep down any medicines or water. This information is not intended to replace advice given to you by your health care provider. Make sure you discuss any questions you have with your health care provider. Document Released: 10/28/2007 Document Revised: 10/17/2015 Document Reviewed: 04/01/2015 Elsevier Interactive Patient Education  [employer].

## 2017-10-02 LAB — URINE CULTURE

## 2017-10-03 NOTE — Progress Notes (Signed)
No significant bacterial colonies found.

## 2017-10-04 ENCOUNTER — Telehealth: Payer: Self-pay | Admitting: Medical

## 2017-10-04 NOTE — Telephone Encounter (Signed)
Judith Nelson will call patient . ty

## 2017-10-06 ENCOUNTER — Other Ambulatory Visit: Payer: Self-pay | Admitting: Medical

## 2017-10-06 DIAGNOSIS — J45909 Unspecified asthma, uncomplicated: Secondary | ICD-10-CM

## 2017-10-06 DIAGNOSIS — J301 Allergic rhinitis due to pollen: Secondary | ICD-10-CM

## 2017-10-11 ENCOUNTER — Other Ambulatory Visit: Payer: Self-pay | Admitting: Medical

## 2017-10-11 DIAGNOSIS — J301 Allergic rhinitis due to pollen: Secondary | ICD-10-CM

## 2017-10-11 DIAGNOSIS — J45909 Unspecified asthma, uncomplicated: Secondary | ICD-10-CM

## 2017-10-15 ENCOUNTER — Other Ambulatory Visit: Payer: Self-pay | Admitting: Adult Health

## 2017-12-01 ENCOUNTER — Other Ambulatory Visit: Payer: Self-pay | Admitting: Adult Health

## 2017-12-01 DIAGNOSIS — J301 Allergic rhinitis due to pollen: Secondary | ICD-10-CM

## 2017-12-01 DIAGNOSIS — J45909 Unspecified asthma, uncomplicated: Secondary | ICD-10-CM

## 2017-12-01 MED ORDER — FLUTICASONE-SALMETEROL 250-50 MCG/DOSE IN AEPB: 1.0000 | INHALATION_SPRAY | Freq: Two times a day (BID) | RESPIRATORY_TRACT | 1 refills | Status: DC

## 2017-12-01 NOTE — Progress Notes (Signed)
Meds ordered this encounter  Medications  . Fluticasone-Salmeterol (ADVAIR DISKUS) 250-50 MCG/DOSE AEPB    Sig: Inhale 1 puff into the lungs 2 (two) times daily.    Dispense:  1 each    Refill:  1   Discussed with Liz Malady PA-C ok to refill x 2. No contact with patient was made. This was a refill request was received by Mallory Shirk RN from pharmacy.

## 2017-12-25 ENCOUNTER — Other Ambulatory Visit: Payer: Self-pay | Admitting: Adult Health

## 2017-12-25 DIAGNOSIS — J45909 Unspecified asthma, uncomplicated: Secondary | ICD-10-CM

## 2017-12-25 DIAGNOSIS — J301 Allergic rhinitis due to pollen: Secondary | ICD-10-CM

## 2017-12-28 IMAGING — CT CT RENAL STONE PROTOCOL
2 of 4 series · 16 of 46 positions shown, 18 images · non-contrast
Comparison: None.

CLINICAL DATA: Patient with left flank and abdominal pain. Recent
diagnosis urinary tract infection.

EXAM:
CT ABDOMEN AND PELVIS WITHOUT CONTRAST
TECHNIQUE: Multidetector CT imaging of the abdomen and pelvis was performed
following the standard protocol without IV contrast.

[Series 2: stone full standard · axial · 0.83mm/px · z∈[-969,-529]mm · 13 of 98 slices shown, 15 images]
[im 5/98  soft-tissue]
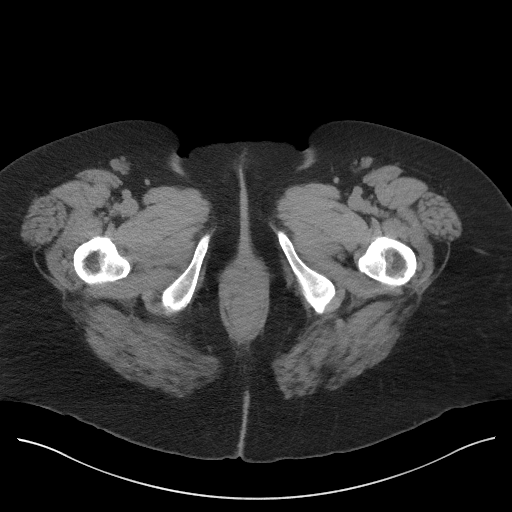
[im 5/98  bone]
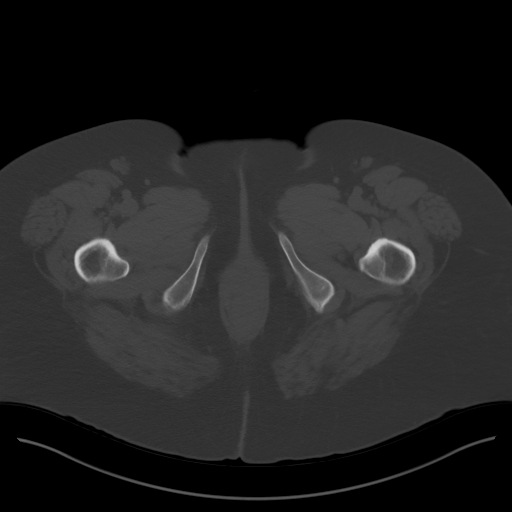
[im 13/98  soft-tissue]
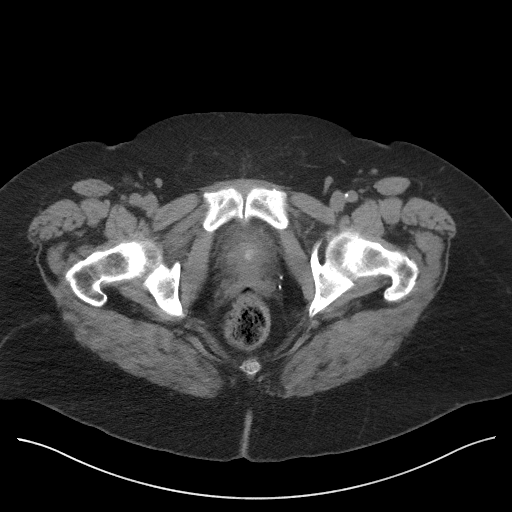
[im 22/98  soft-tissue]
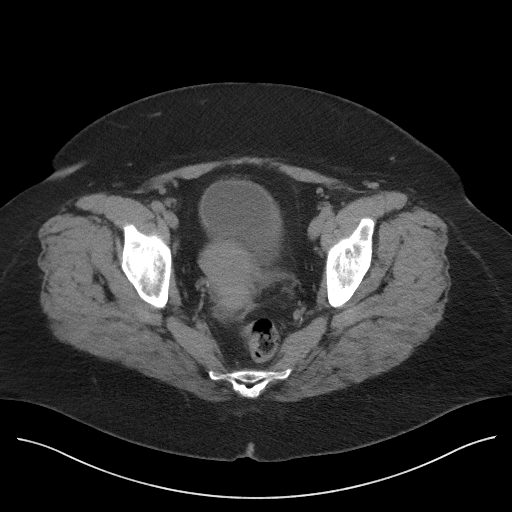
[im 26/98  soft-tissue]
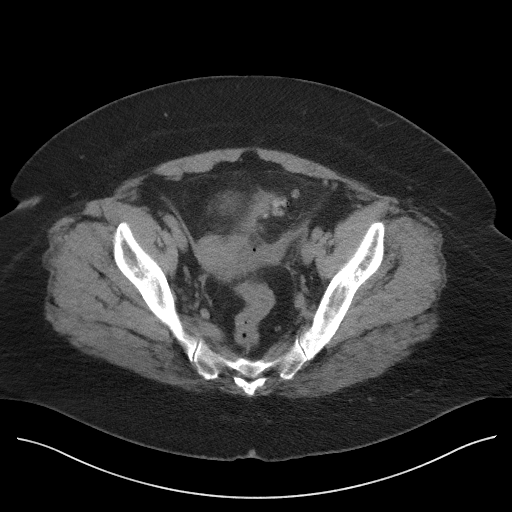
[im 34/98  soft-tissue]
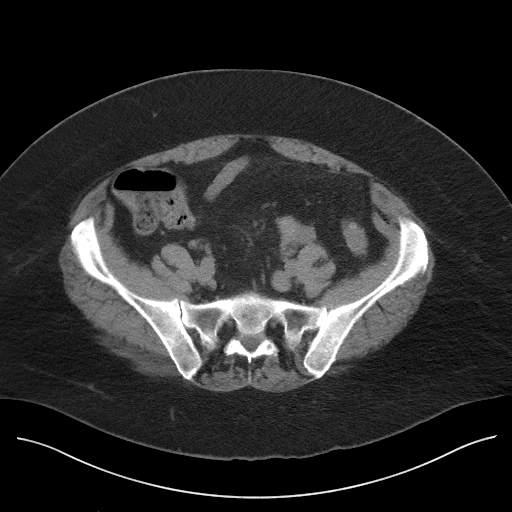
[im 43/98  soft-tissue]
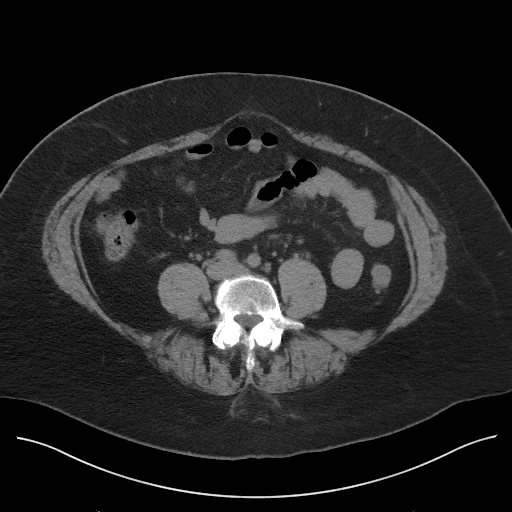
[im 51/98  soft-tissue]
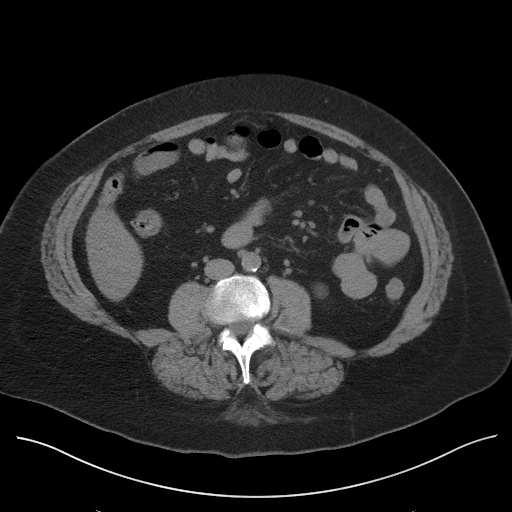
[im 55/98  soft-tissue]
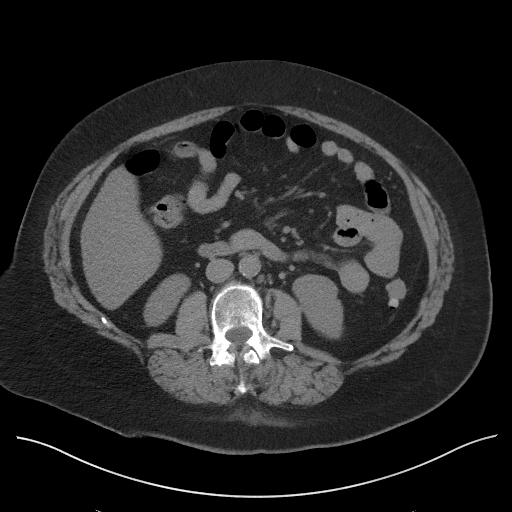
[im 64/98  soft-tissue]
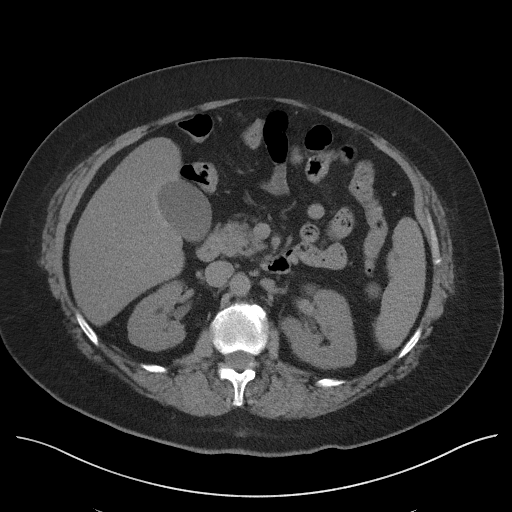
[im 64/98  bone]
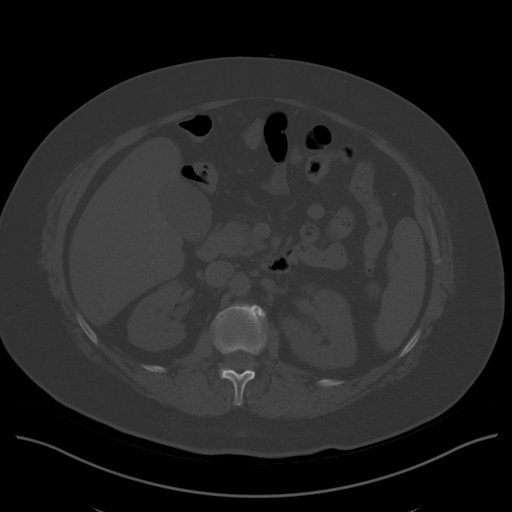
[im 72/98  soft-tissue]
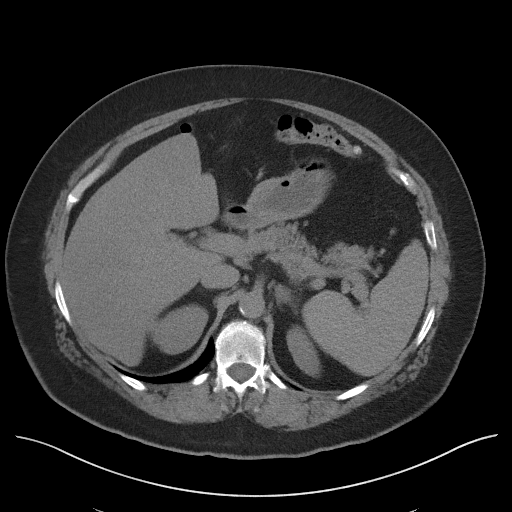
[im 76/98  soft-tissue]
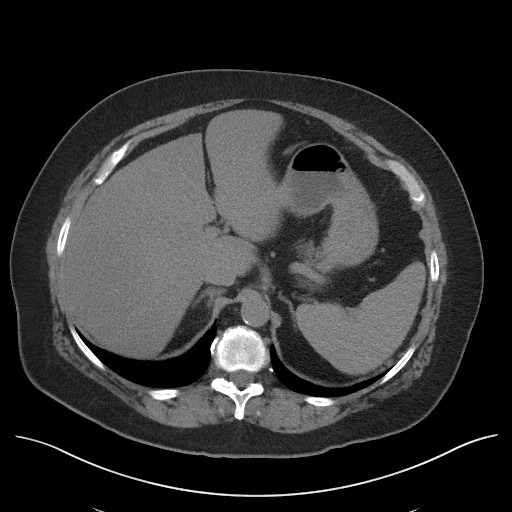
[im 85/98  soft-tissue]
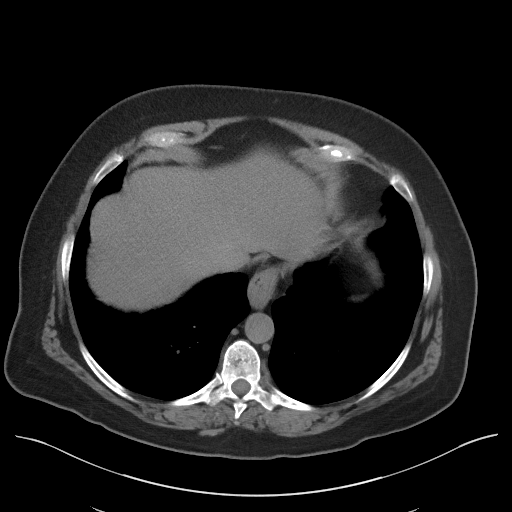
[im 93/98  soft-tissue]
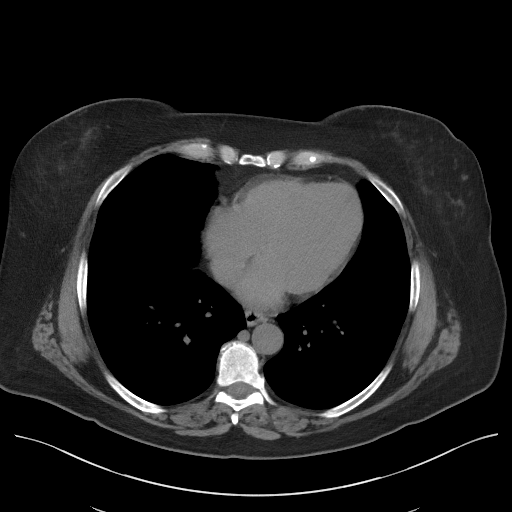

[Series 5: coronal · coronal · 0.82mm/px · 3 of 149 slices shown]
[im 50/149  soft-tissue]
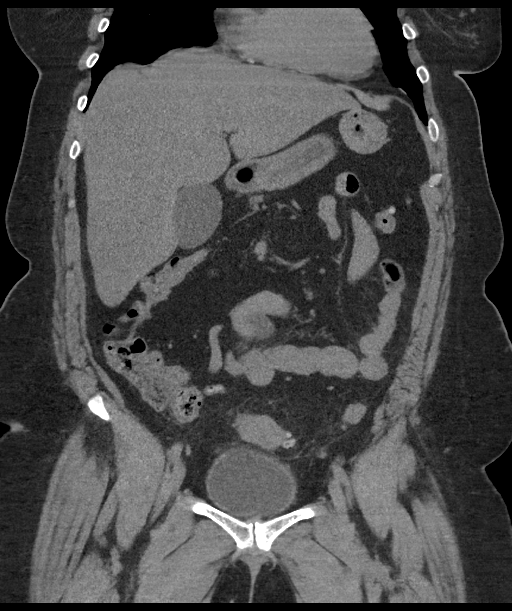
[im 66/149  soft-tissue]
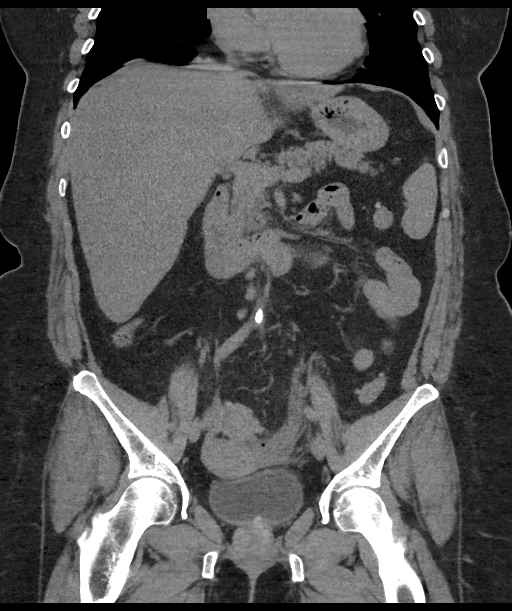
[im 83/149  soft-tissue]
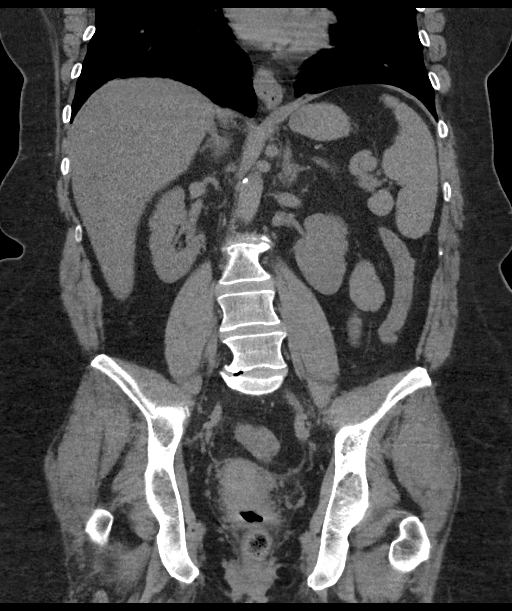

[16 of 46 positions shown; findings below may reference images not displayed]

FINDINGS: Lower chest: Normal cardiac contours. Dependent atelectasis within
the bilateral lower lobes. No pleural effusion.

Hepatobiliary: Liver is diffusely low in attenuation compatible with
steatosis. Fatty sparing adjacent to the gallbladder fossa.
Gallbladder is unremarkable. No intrahepatic or extrahepatic biliary
ductal dilatation.

Pancreas: Unremarkable

Spleen: Unremarkable

Adrenals/Urinary Tract: The adrenal glands are normal. Kidneys are
mildly lobular in contour. No hydronephrosis. There is peripheral
high attenuation about the urethra (image 88; series 2). No
nephroureterolithiasis.

Stomach/Bowel: The sigmoid colon is decompressed. There is sigmoid
colonic diverticulosis. Additionally there is fat stranding and
small amount of fluid adjacent to the sigmoid colon with a small
foci of gas (image 73; series 2), concerning for acute
diverticulitis. Small hiatal hernia. The appendix is normal.

Vascular/Lymphatic: Normal caliber abdominal aorta. Peripheral
calcified and noncalcified atherosclerotic plaque. No
retroperitoneal lymphadenopathy.

Reproductive: The left ovary is mildly enlarged and has increased
density centrally. The left ovary measures 4.0 x 3.0 cm. There is
fat stranding about the left adnexa.

Other: Diastases of the rectus abdominus.

Musculoskeletal: Lower thoracic and lumbar spine degenerative
changes. No aggressive or acute appearing osseous lesions.
IMPRESSION: Findings most compatible with acute sigmoid diverticulitis. Small
foci of gas adjacent to the sigmoid colon may represent micro
perforation. No evidence for adjacent abscess formation.

The left ovary is enlarged and contains central high attenuation.
Findings may represent a hemorrhagic cyst. In the nonacute setting,
recommend correlation with pelvic ultrasound.

Peripheral high attenuation about the urethra which is nonspecific
on noncontrast enhanced exam however may represent proteinaceous
material/stone within a urethral diverticulum. Recommend nonemergent
urologic consultation and possible MRI as clinically indicated.

Hepatic steatosis.

## 2018-02-10 ENCOUNTER — Ambulatory Visit: Payer: Self-pay

## 2018-03-31 ENCOUNTER — Other Ambulatory Visit: Payer: Self-pay | Admitting: Adult Health

## 2018-03-31 DIAGNOSIS — J45909 Unspecified asthma, uncomplicated: Secondary | ICD-10-CM

## 2018-03-31 DIAGNOSIS — J301 Allergic rhinitis due to pollen: Secondary | ICD-10-CM

## 2018-04-07 ENCOUNTER — Other Ambulatory Visit: Payer: Self-pay | Admitting: Adult Health

## 2018-04-07 DIAGNOSIS — J45909 Unspecified asthma, uncomplicated: Secondary | ICD-10-CM

## 2018-04-07 DIAGNOSIS — J301 Allergic rhinitis due to pollen: Secondary | ICD-10-CM

## 2018-04-07 MED ORDER — FLUTICASONE-SALMETEROL 250-50 MCG/DOSE IN AEPB: 1.0000 | INHALATION_SPRAY | Freq: Two times a day (BID) | RESPIRATORY_TRACT | 0 refills | Status: DC

## 2018-04-21 ENCOUNTER — Other Ambulatory Visit: Payer: Self-pay | Admitting: Adult Health

## 2018-04-21 DIAGNOSIS — J45909 Unspecified asthma, uncomplicated: Secondary | ICD-10-CM

## 2018-04-21 DIAGNOSIS — J301 Allergic rhinitis due to pollen: Secondary | ICD-10-CM

## 2018-11-17 ENCOUNTER — Ambulatory Visit: Payer: Self-pay | Admitting: Nurse Practitioner

## 2018-11-17 ENCOUNTER — Other Ambulatory Visit: Payer: Self-pay

## 2018-11-17 ENCOUNTER — Encounter: Payer: Self-pay | Admitting: Nurse Practitioner

## 2018-11-17 VITALS — BP 177/82 | HR 83 | Temp 97.1°F | Resp 16 | Ht 67.0 in | Wt 271.0 lb

## 2018-11-17 DIAGNOSIS — R350 Frequency of micturition: Secondary | ICD-10-CM

## 2018-11-17 DIAGNOSIS — N3001 Acute cystitis with hematuria: Secondary | ICD-10-CM

## 2018-11-17 LAB — POCT URINALYSIS DIPSTICK
Appearance: NORMAL
Bilirubin, UA: NEGATIVE
Glucose, UA: NEGATIVE
Ketones, UA: NEGATIVE
Nitrite, UA: NEGATIVE
Protein, UA: NEGATIVE
Spec Grav, UA: 1.01 (ref 1.010–1.025)
Urobilinogen, UA: 0.2 E.U./dL
pH, UA: 7 (ref 5.0–8.0)

## 2018-11-17 MED ORDER — NITROFURANTOIN MONOHYD MACRO 100 MG PO CAPS: 100.0000 mg | ORAL_CAPSULE | Freq: Two times a day (BID) | ORAL | 0 refills | Status: DC

## 2018-11-17 NOTE — Progress Notes (Signed)
   Subjective:    Patient ID: Judith Nelson, female    DOB: [birth year], 63 y.o.   MRN: [id number]  HPI Judith Nelson reports urinary frequency/urgency, low back pain x 4 days. She reports that her urine appears cloudy today. She denies abdominal pain, hematuria, dysuria. Has been hydrating and cranberry tablets.    Review of Systems  Constitutional: Negative for fatigue and fever.  Respiratory: Negative for shortness of breath.   Genitourinary: Positive for flank pain and urgency. Negative for dysuria.  Musculoskeletal: Positive for myalgias.       Objective:   Physical Exam Vitals signs reviewed.  Constitutional:      General: She is not in acute distress.    Appearance: She is well-developed. She is obese.  HENT:     Head: Normocephalic and atraumatic.  Neck:     Musculoskeletal: Normal range of motion and neck supple.  Cardiovascular:     Rate and Rhythm: Normal rate and regular rhythm.     Heart sounds: Normal heart sounds. No murmur.  Pulmonary:     Effort: Pulmonary effort is normal. No respiratory distress.     Breath sounds: Normal breath sounds.  Abdominal:     General: Bowel sounds are normal.     Palpations: Abdomen is soft.     Tenderness: There is no abdominal tenderness. There is no right CVA tenderness or left CVA tenderness.  Musculoskeletal: Normal range of motion.  Skin:    General: Skin is warm and dry.  Neurological:     General: No focal deficit present.     Mental Status: She is alert and oriented to person, place, and time.  Psychiatric:        Mood and Affect: Mood normal.           Assessment & Plan:

## 2018-11-17 NOTE — Patient Instructions (Addendum)
Continue to hydrate self and take medication as directed Although you report your blood pressure is up when you go into clinics and there's a history of hypertension, I encourage you to follow up with PCP regarding your blood pressure Encouraged patient to call the office or primary care doctor for an appointment if no improvement in symptoms or if symptoms change or worsen after 72 hours of planned treatment. Patient verbalized understanding of all instructions given/reviewed and has no further questions or concerns at this time.     Urinary Tract Infection, Adult  A urinary tract infection (UTI) is an infection of any part of the urinary tract. The urinary tract includes:  The kidneys.  The ureters.  The bladder.  The urethra. These organs make, store, and get rid of pee (urine) in the body. What are the causes? This is caused by germs (bacteria) in your genital area. These germs grow and cause swelling (inflammation) of your urinary tract. What increases the risk? You are more likely to develop this condition if:  You have a small, thin tube (catheter) to drain pee.  You cannot control when you pee or poop (incontinence).  You are female, and: ? You use these methods to prevent pregnancy: ? A medicine that kills sperm (spermicide). ? A device that blocks sperm (diaphragm). ? You have low levels of a female hormone (estrogen). ? You are pregnant.  You have genes that add to your risk.  You are sexually active.  You take antibiotic medicines.  You have trouble peeing because of: ? A prostate that is bigger than normal, if you are female. ? A blockage in the part of your body that drains pee from the bladder (urethra). ? A kidney stone. ? A nerve condition that affects your bladder (neurogenic bladder). ? Not getting enough to drink. ? Not peeing often enough.  You have other conditions, such as: ? Diabetes. ? A weak disease-fighting system (immune system). ? Sickle  cell disease. ? Gout. ? Injury of the spine. What are the signs or symptoms? Symptoms of this condition include:  Needing to pee right away (urgently).  Peeing often.  Peeing small amounts often.  Pain or burning when peeing.  Blood in the pee.  Pee that smells bad or not like normal.  Trouble peeing.  Pee that is cloudy.  Fluid coming from the vagina, if you are female.  Pain in the belly or lower back. Other symptoms include:  Throwing up (vomiting).  No urge to eat.  Feeling mixed up (confused).  Being tired and grouchy (irritable).  A fever.  Watery poop (diarrhea). How is this treated? This condition may be treated with:  Antibiotic medicine.  Other medicines.  Drinking enough water. Follow these instructions at home:   Medicines  Take over-the-counter and prescription medicines only as told by your doctor.  If you were prescribed an antibiotic medicine, take it as told by your doctor. Do not stop taking it even if you start to feel better. General instructions  Make sure you: ? Pee until your bladder is empty. ? Do not hold pee for a long time. ? Empty your bladder after sex. ? Wipe from front to back after pooping if you are a female. Use each tissue one time when you wipe.  Drink enough fluid to keep your pee pale yellow.  Keep all follow-up visits as told by your doctor. This is important. Contact a doctor if:  You do not get better after 1-2  days.  Your symptoms go away and then come back. Get help right away if:  You have very bad back pain.  You have very bad pain in your lower belly.  You have a fever.  You are sick to your stomach (nauseous).  You are throwing up. Summary  A urinary tract infection (UTI) is an infection of any part of the urinary tract.  This condition is caused by germs in your genital area.  There are many risk factors for a UTI. These include having a small, thin tube to drain pee and not being  able to control when you pee or poop.  Treatment includes antibiotic medicines for germs.  Drink enough fluid to keep your pee pale yellow. This information is not intended to replace advice given to you by your health care provider. Make sure you discuss any questions you have with your health care provider. Document Released: 10/28/2007 Document Revised: 11/18/2017 Document Reviewed: 11/18/2017 Elsevier Interactive Patient Education  2019 [employer].

## 2018-11-20 LAB — URINE CULTURE

## 2018-12-12 ENCOUNTER — Other Ambulatory Visit: Payer: Self-pay

## 2018-12-12 ENCOUNTER — Telehealth: Payer: Self-pay | Admitting: Medical

## 2018-12-12 DIAGNOSIS — R3915 Urgency of urination: Secondary | ICD-10-CM

## 2018-12-12 DIAGNOSIS — Z760 Encounter for issue of repeat prescription: Secondary | ICD-10-CM

## 2018-12-12 DIAGNOSIS — R350 Frequency of micturition: Secondary | ICD-10-CM

## 2018-12-12 MED ORDER — AMOXICILLIN-POT CLAVULANATE 875-125 MG PO TABS: 1.0000 | ORAL_TABLET | Freq: Two times a day (BID) | ORAL | 0 refills | Status: AC

## 2018-12-12 MED ORDER — ALBUTEROL SULFATE HFA 108 (90 BASE) MCG/ACT IN AERS: 2.0000 | INHALATION_SPRAY | Freq: Four times a day (QID) | RESPIRATORY_TRACT | 0 refills | Status: AC | PRN

## 2018-12-12 NOTE — Progress Notes (Signed)
Patient gives permission to do telemedicine appointment. Started Thursday or Friday with pressure to urinate and decreased output    Drank more water and probiotics and cranberry juice  On Kevita.(which is probiotics over the weekend. Clear urine now but was cloudy over the weekend. Seen on  11/17/2018 and treated for UTI due to frequency and urgency and  back pain, flank pain and myalgias as well as cloudy urine. Finished 7 day course of  twice daily  Macrobid. Did not feel as if it was completely resolved. Culture came back with E.Coli infection and was sensitive to [place].  2 werks ago had pain in lower stomach , crampy and watery diarrhea all the way across,  Able to make it to the bathroom,  Going approximately  At least 5 times x Monday through Wedesday.      Feels clammy at times but not sure if it is menopause symptoms, no chills, lower back a little lower back pain but she has that on occasion and not sure if she has done something., no flank pain now. .   Allergies  Allergen Reactions  . Hydrocodone-Chlorpheniramine Other (See Comments)    Insomnia   . Sulfa Antibiotics Other (See Comments)    Fever- had reaction as a child    Current Outpatient Medications:  .  Ascorbic Acid (VITAMIN C WITH ROSE HIPS) 1000 MG tablet, Take 1,000 mg by mouth 2 times daily at 12 noon and 4 pm., Disp: , Rfl:  .  Fluticasone-Salmeterol (ADVAIR DISKUS) 250-50 MCG/DOSE AEPB, Inhale 1 puff into the lungs 2 (two) times daily. See primary care for any further refills, Disp: 1 each, Rfl: 0 .  loratadine (CLARITIN) 10 MG tablet, One daily as needed for itching and sneezing, Disp: 30 tablet, Rfl:  .  PROAIR HFA 108 (90 Base) MCG/ACT inhaler, TAKE 2 PUFFS BY MOUTH EVERY 6 HOURS AS NEEDED FOR WHEEZE OR SHORTNESS OF BREATH, Disp: 8.5 Inhaler, Rfl: 0 .  vitamin B-12 (CYANOCOBALAMIN) 500 MCG tablet, Take 500 mcg by mouth daily., Disp: , Rfl:    Suggested Cipro and reviewed tendonitis and injury possible with  this medication. She prefers not to risk these symptoms, so then I recommended Augmentin.  Recommended uranalysis and C&S sent to [facility] Will also recheck patient blood pressure and temperature. She can come to the clinic now.   PE no physical due to telemedicine appointment.  DX: frequency,  and pressure decreased output. possible  UTI Sample  patient can come to clinic today for urinalysis and C&S through [facility].. Reviewed possible other reasons for pressure and  decreased urine output. To follow up with her primary doctor if no improving in  3 -5 days, continue lots of water. Will contact patient once lab results are received. May use AZO per package directions for  2 days only.  Needs to also follow up with primary due to elevated blood pressure.  Medication refill,  Patient request refill on Proair, last written  12/2017.  Lab Orders     Urine Culture     Urinalysis, Routine w reflex microscopic Meds ordered this encounter  Medications  . amoxicillin-clavulanate (AUGMENTIN) 875-125 MG tablet    Sig: Take 1 tablet by mouth 2 (two) times daily.    Dispense:  20 tablet    Refill:  0   Follow up with her primary doctor  In 3- 7 days for UTI symptoms not improving  And for blood pressure recheck. Explained to patient culture can take 2-7 days  to be received. Patient verbalizes understanding and has no questions at the end of our conversation.  Blood pressure 165/75 Temp 98.8 HR 82 Urine color cloudy yellow Due to patient blood pressure and urine, asked patient to call her primary today and follow up appoint with her PCP within one week. Patient verbalizes understanding.

## 2018-12-12 NOTE — Patient Instructions (Signed)
Urinary Frequency, Adult Urinary frequency means urinating more often than usual. You may urinate every 1-2 hours even though you drink a normal amount of fluid and do not have a bladder infection or condition. Although you urinate more often than normal, the total amount of urine produced in a day is normal. With urinary frequency, you may have an urgent need to urinate often. The stress and anxiety of needing to find a bathroom quickly can make this urge worse. This condition may go away on its own or you may need treatment at home. Home treatment may include bladder training, exercises, taking medicines, or making changes to your diet. Follow these instructions at home: Bladder health   Keep a bladder diary if told by your health care provider. Keep track of: ? What you eat and drink. ? How often you urinate. ? How much you urinate.  Follow a bladder training program if told by your health care provider. This may include: ? Learning to delay going to the bathroom. ? Double urinating (voiding). This helps if you are not completely emptying your bladder. ? Scheduled voiding.  Do Kegel exercises as told by your health care provider. Kegel exercises strengthen the muscles that help control urination, which may help the condition. Eating and drinking  If told by your health care provider, make diet changes, such as: ? Avoiding caffeine. ? Drinking fewer fluids, especially alcohol. ? Not drinking in the evening. ? Avoiding foods or drinks that may irritate the bladder. These include coffee, tea, soda, artificial sweeteners, citrus, tomato-based foods, and chocolate. ? Eating foods that help prevent or ease constipation. Constipation can make this condition worse. Your health care provider may recommend that you:  Drink enough fluid to keep your urine pale yellow.  Take over-the-counter or prescription medicines.  Eat foods that are high in fiber, such as beans, whole grains, and fresh  fruits and vegetables.  Limit foods that are high in fat and processed sugars, such as fried or sweet foods. General instructions  Take over-the-counter and prescription medicines only as told by your health care provider.  Keep all follow-up visits as told by your health care provider. This is important. Contact a health care provider if:  You start urinating more often.  You feel pain or irritation when you urinate.  You notice blood in your urine.  Your urine looks cloudy.  You develop a fever.  You begin vomiting. Get help right away if:  You are unable to urinate. Summary  Urinary frequency means urinating more often than usual. With urinary frequency, you may urinate every 1-2 hours even though you drink a normal amount of fluid and do not have a bladder infection or other bladder condition.  Your health care provider may recommend that you keep a bladder diary, follow a bladder training program, or make dietary changes.  If told by your health care provider, do Kegel exercises to strengthen the muscles that help control urination.  Take over-the-counter and prescription medicines only as told by your health care provider.  Contact a health care provider if your symptoms do not improve or get worse. This information is not intended to replace advice given to you by your health care provider. Make sure you discuss any questions you have with your health care provider. Document Released: 03/07/2009 Document Revised: 11/18/2017 Document Reviewed: 11/18/2017 Elsevier Patient Education  2020 [employer].

## 2018-12-13 LAB — MICROSCOPIC EXAMINATION
Casts: NONE SEEN /lpf
RBC: >30 /hpf — AB (ref 0–2)
WBC, UA: >30 /hpf — AB (ref 0–5)

## 2018-12-13 LAB — URINALYSIS, ROUTINE W REFLEX MICROSCOPIC
Bilirubin, UA: NEGATIVE
Glucose, UA: NEGATIVE
Ketones, UA: NEGATIVE
Nitrite, UA: POSITIVE — AB
Specific Gravity, UA: 1.019 (ref 1.005–1.030)
Urobilinogen, Ur: 0.2 mg/dL (ref 0.2–1.0)
pH, UA: 6 (ref 5.0–7.5)

## 2018-12-13 NOTE — Progress Notes (Signed)
fyI

## 2018-12-14 NOTE — Progress Notes (Signed)
Hi Judith Nelson, Yes your urine shows an infection. They are now checking the sensitivity of the antibiotic that I put you on. As soon as I get that information I will forward it  to you. Sincerely Daryll Drown PA-C

## 2018-12-15 LAB — URINE CULTURE

## 2018-12-16 ENCOUNTER — Other Ambulatory Visit: Payer: Self-pay

## 2018-12-16 ENCOUNTER — Telehealth: Payer: Self-pay | Admitting: Nurse Practitioner

## 2018-12-16 DIAGNOSIS — N390 Urinary tract infection, site not specified: Secondary | ICD-10-CM

## 2018-12-16 MED ORDER — CIPROFLOXACIN HCL 500 MG PO TABS: 500.0000 mg | ORAL_TABLET | Freq: Two times a day (BID) | ORAL | 0 refills | Status: AC

## 2018-12-16 NOTE — Progress Notes (Signed)
   Subjective:    Patient ID: Judith Nelson, female    DOB: 28-Apr-1956, 63 y.o.   MRN: [id number]  HPI On a telephonic visit with patient permission to treat this morning regarding lab follow up. Patient reports that her symptoms continue and are unchanged from 12/14/2018 office visit. Informed patient that she is growing 2 types of bacteria and will change  therapy because the antibiotic will only treat 1 which was the same type of bacteria that she was growing weeks back and treated per c&s results accordingly. Patient was put on Augmentin 7/22 for UTI with pending c&s. C&S shows e. Coli and Klebsiella Pneumoniae. Writer has discussed with patient that she didn't have Klebsiella Pneumoniae at that time and has grown this new bacteria in addition to the presence of e. Coli. Answered all questions.    Review of Systems  Genitourinary:       Urinary pressure, decreased urine       Objective:   Physical Exam  Telehealth visit      Assessment & Plan:

## 2018-12-16 NOTE — Patient Instructions (Signed)
Stop Augmentin and take Cipro as directed and do not take vitamins 2 hours of taking Cipro Increase fluid intake Monitor for worsening or no improvement in symptoms Recommended Urology referral with recurrent UTI's but refuses today and will call office if she changes her mind RTC as needed

## 2018-12-19 NOTE — Progress Notes (Signed)
Hi Judith Nelson, I am hoping you are feeling better on the new medication Cipro. Please follow up with your doctor after you finish your antibiotics for a recheck of your urine. Your blood pressure was also elevated in the clinic and needs to be reviewed by your PCP as well. Sincerely Daryll Drown PA-C

## 2018-12-21 ENCOUNTER — Telehealth: Payer: Self-pay | Admitting: Medical

## 2018-12-21 NOTE — Telephone Encounter (Signed)
Judith Nelson  Wed 12/21/2018 10:58 AM  ?  ?  ?  ?  ? Patient sent note to  [email or web address] email,(see below) requesting extenstion of antibiotics for what she may thinks may have been  a microburst/diverticulitis episode 2 weeks ago..  I have emailed the patient back through my chart recommending she needs further evaluation. Either by her doctor, or a  GI doctor  Or if urgent care is needed through  [facility] or the Emergency Department if she feels this is an emergency. I do not feel comfortable in prescribing a longer course of antibiotics without further evaluation.  A short note was sent through [email or web address] email to have patient check MyChart messages.       To:  Daryll Drown Dear Dr. Lily Peer, Please allow me a few minutes to tell you the history of my illness, as I see it, and to ask if it makes any sense to a professional like you. In late January 2018, I had a microburst in my lower intestine, diagnosed as diverticulitis. The doctor decided not to do surgery (yes, I begged him!) if we could avoid it and let it heal on its own. I spent 7 days in the hospital on IV of antibiotics & pain meds. then was sent home with 7 more days of antibiotics. I was on a liquid diet the whole time because anything solid had to go past that sore in my intestine and it hurt so bad. Mostly in the lower cradle of my stomach and a very sharp pain in my left side. I had 3 children and this pain was so much worse than that. I did recover and up to now haven't had any reoccurrence. About 4 weeks ago, all of a sudden, I suffered from very bad diarrhea, lower abdomen pain and the sharp left side pain. I went back on liquids to clear/ease the symptoms and then the UTI symptoms started. I came to the wellness, did a specimen and got some antibiotics. After 2 weeks in, I still felt what I thought was the UTI symptoms, so I came back to wellness, did another urine test and got the results that I needed to be on a different  antibiotic for the resistant type of infection I had, klebsiella pneumoniae. After day 4 of taking the Cipro, I still have a small sore in my left side but it is tons better than a week ago. I think that I had another small microburst in the first place and I need to be on the Cipro for longer than the prescription that I received. I want to ask that you prescribe another 7 days of Cipro to make sure that the bacteria is cleared. I am not able to get to my primary care Doctor before I run out of the antibiotic. Can you look at my records and agree that this may help? I wanted to call and talk to you but I am in an open area workspace and afraid I couldn't talk freely. Thanks for your time, Judith Nelson

## 2018-12-21 NOTE — Telephone Encounter (Signed)
Was going to send a My Chart message to patient, however she does not have My Chart.   Recommended she be further evaluated  by her doctor, or a  GI  doctor  Or if urgent care is needed through  [facility] or the Emergency Department if she feels this is an emergency. Sent through her [employer].   Asked patient to sign up for My Chart.

## 2018-12-23 ENCOUNTER — Telehealth: Payer: Self-pay

## 2018-12-23 NOTE — Telephone Encounter (Signed)
Follow up phone call at request of provider to see if:  1.  Correspondence had been completed with Stacy Gardner, PA about GI appt?   2.  Do you still have any UTI signs or symptoms?  Called pt and pt states she has not followed up with GI yet and is not having any s/s of UTI however states she does not feel the 7 day course of Cipro was enough so has started taking her previously prescribed Augmentin; states her urine is dark but she is increasing her water intake; she states she feels she needs 10 days worth of antibiotics;  States she has been dealing with this issue for 40 years; states she has not routinely followed up with any specific doctor about GI or urinary issues and she usually knows what she needs for treatment;  states she feels this is a diverticulitis "burst"; pt states she cannot remember the name of the GI doctor that performed her endoscopy in 2018; upon chart review it appears it was Dr. Allen Norris;  After speaking with provider Ralene Ok, NP, it was recommended she follow up with Dr. Allen Norris and pt states she would call and follow up; also recommended referral to urologist which pt refused and encouraged follow up UA in 1 week which pt refused; states she will continue to increase fiber and water intake and liquid diet which helps with the GI issues

## 2019-03-08 ENCOUNTER — Other Ambulatory Visit: Payer: Self-pay

## 2019-03-08 ENCOUNTER — Ambulatory Visit: Payer: Self-pay

## 2019-03-08 DIAGNOSIS — Z23 Encounter for immunization: Secondary | ICD-10-CM

## 2019-09-08 ENCOUNTER — Telehealth: Payer: Self-pay | Admitting: Nurse Practitioner

## 2019-09-08 ENCOUNTER — Other Ambulatory Visit: Payer: Self-pay

## 2019-09-08 ENCOUNTER — Encounter: Payer: Self-pay | Admitting: Nurse Practitioner

## 2019-09-08 DIAGNOSIS — J45909 Unspecified asthma, uncomplicated: Secondary | ICD-10-CM

## 2019-09-08 DIAGNOSIS — J301 Allergic rhinitis due to pollen: Secondary | ICD-10-CM

## 2019-09-08 MED ORDER — ALBUTEROL SULFATE HFA 108 (90 BASE) MCG/ACT IN AERS: INHALATION_SPRAY | RESPIRATORY_TRACT | 0 refills | Status: AC

## 2019-09-08 MED ORDER — FLUTICASONE-SALMETEROL 250-50 MCG/DOSE IN AEPB: 1.0000 | INHALATION_SPRAY | Freq: Two times a day (BID) | RESPIRATORY_TRACT | 0 refills | Status: AC

## 2019-09-08 NOTE — Patient Instructions (Signed)
I have done a courtesy refill for your 2 requested prescriptions  Please take them as directed Return or call the clinic as needed

## 2019-09-08 NOTE — Progress Notes (Signed)
   Subjective:    Patient ID: Judith Nelson, female    DOB: March 04, 1956, 64 y.o.   MRN: [id number]  HPILinda is on a telehealth visit today requesting a refill on her Proair and Advair Diskus. She reports she doesn't take the medications daily but when she feels she needs it she they are helpful. She reports she has notice some triggers such as being outside. She has used them this week and uses the Proair in the mornings when she awakes at times. She denies any current SOB or wheezing. She just wishes to have them refilled today because she noticed she was getting low.   Review of Systems  Respiratory: Negative for shortness of breath and wheezing.        Objective:   Physical Exam Constitutional:      General: She is not in acute distress.    Appearance: Normal appearance. She is ill-appearing.  HENT:     Head: Normocephalic and atraumatic.  Pulmonary:     Effort: Pulmonary effort is normal.  Skin:    Comments: No evidence of diaphoresis  Neurological:     Mental Status: She is alert and oriented to person, place, and time.  Psychiatric:        Mood and Affect: Mood normal.        Behavior: Behavior normal.           Assessment & Plan:

## 2021-06-18 ENCOUNTER — Other Ambulatory Visit (HOSPITAL_COMMUNITY): Payer: Self-pay

## 2024-04-28 ENCOUNTER — Other Ambulatory Visit (HOSPITAL_COMMUNITY): Payer: Self-pay
